# Patient Record
Sex: Male | Born: 1960 | Race: White | Hispanic: No | Marital: Married | Smoking: Never smoker
Health system: Southern US, Community
[De-identification: ages and names within clinical notes are randomized; demographics above are authoritative.]

## PROBLEM LIST (undated history)

## (undated) DIAGNOSIS — Z8719 Personal history of other diseases of the digestive system: Secondary | ICD-10-CM

## (undated) DIAGNOSIS — M1990 Unspecified osteoarthritis, unspecified site: Secondary | ICD-10-CM

## (undated) DIAGNOSIS — I10 Essential (primary) hypertension: Secondary | ICD-10-CM

## (undated) DIAGNOSIS — M5126 Other intervertebral disc displacement, lumbar region: Secondary | ICD-10-CM

## (undated) DIAGNOSIS — K219 Gastro-esophageal reflux disease without esophagitis: Secondary | ICD-10-CM

## (undated) DIAGNOSIS — J302 Other seasonal allergic rhinitis: Secondary | ICD-10-CM

## (undated) DIAGNOSIS — N201 Calculus of ureter: Secondary | ICD-10-CM

## (undated) DIAGNOSIS — J189 Pneumonia, unspecified organism: Secondary | ICD-10-CM

## (undated) HISTORY — DX: Other intervertebral disc displacement, lumbar region: M51.26

## (undated) HISTORY — DX: Essential (primary) hypertension: I10

## (undated) HISTORY — PX: WISDOM TOOTH EXTRACTION: SHX21

## (undated) HISTORY — DX: Other seasonal allergic rhinitis: J30.2

## (undated) HISTORY — PX: VASECTOMY: SHX75

## (undated) HISTORY — PX: ESOPHAGOGASTRODUODENOSCOPY: SHX1529

## (undated) HISTORY — PX: TONSILLECTOMY: SUR1361

---

## 2001-06-04 ENCOUNTER — Encounter: Payer: Self-pay | Admitting: Unknown Physician Specialty

## 2001-06-04 ENCOUNTER — Ambulatory Visit (HOSPITAL_COMMUNITY): Admission: RE | Admit: 2001-06-04 | Discharge: 2001-06-04 | Payer: Self-pay | Admitting: Unknown Physician Specialty

## 2009-01-27 ENCOUNTER — Encounter (INDEPENDENT_AMBULATORY_CARE_PROVIDER_SITE_OTHER): Payer: Self-pay | Admitting: *Deleted

## 2009-02-18 ENCOUNTER — Ambulatory Visit: Payer: Self-pay | Admitting: Gastroenterology

## 2009-02-18 DIAGNOSIS — R1310 Dysphagia, unspecified: Secondary | ICD-10-CM | POA: Insufficient documentation

## 2009-02-19 ENCOUNTER — Ambulatory Visit: Payer: Self-pay | Admitting: Gastroenterology

## 2009-02-19 ENCOUNTER — Ambulatory Visit (HOSPITAL_COMMUNITY): Admission: RE | Admit: 2009-02-19 | Discharge: 2009-02-19 | Payer: Self-pay | Admitting: Gastroenterology

## 2009-03-05 ENCOUNTER — Encounter: Payer: Self-pay | Admitting: Gastroenterology

## 2009-04-16 ENCOUNTER — Ambulatory Visit: Payer: Self-pay | Admitting: Gastroenterology

## 2009-04-17 DIAGNOSIS — K222 Esophageal obstruction: Secondary | ICD-10-CM

## 2011-04-05 NOTE — Op Note (Signed)
Michael Cochran               ACCOUNT NO.:  [id number]   MEDICAL RECORD NO.:  [id number]          PATIENT TYPE:  AMB   LOCATION:  DAY                           FACILITY:  [facility]   PHYSICIAN:  Kassie Mends, M.D.      DATE OF BIRTH:  1961/04/27   DATE OF PROCEDURE:  02/19/2009  DATE OF DISCHARGE:                               OPERATIVE REPORT   REFERRING PHYSICIAN:  Kingsley Callander. Ouida Sills, MD   PROCEDURE:  Esophagogastroduodenoscopy with Savary dilation to 16 mm.   SURGEON:  Kassie Mends, MD   INDICATION FOR EXAMINATION:  Michael Cochran is a 50 year old male who  complaints of solid dysphasia over the last 6-8 years.  His symptoms are  getting worse.  Again, often feels food lodges in his esophagus and then  released.   FINDINGS:  1. Distal esophageal stricture.  The area had surrounding mild      erythema consistent with an impacted food bolus which was      previously lodged and passed.  The distal esophagus was dilated      with the Savary dilators from 12.8 mm to 16 mm.  No evidence of      Barrett mass, erosions, or ulcerations were seen.  2. Normal stomach.  3. Normal duodenal bulb and second portion of the duodenum with mild-      to-moderate bile staining.   DIAGNOSIS:  Distal esophageal stricture, status post dilation.   RECOMMENDATIONS:  1. Soft mechanical diet for 24 hours then high-fiber diet.  2. No aspirin, NSAIDs, or anticoagulation for 7 days.  3. Michael Cochran already has an appointment to follow up with me in 6-8      weeks.   MEDICATIONS:  1. Demerol 75 mg IV.  2. Versed 5 mg IV.   PROCEDURE TECHNIQUE:  Physical exam was performed.  Informed consent was  obtained from the patient after explaining benefits, risks, and  alternatives to the procedure.  The patient was connected to monitor and  placed in left lateral position.  Continuous oxygen was provided by  nasal cannula.  IV medicine administered through an indwelling cannula.  After administration of sedation, the  patient's esophagus was intubated  and the scope was advanced under direct visualization to the second  portion of the duodenum.  The scope was withdrawn into the stomach and  retroflexed view of the cardia was performed.  The Savary guidewire was  introduced.  The scope was withdrawn slowly by carefully examining the  color, texture, anatomy, and integrity of the mucosa on the way out.  Each dilator was passed over the wire successively from 12.8 to 16 mm.  The 12.8-mm passed with no resistance and the 16-mm passed with mild-to-  moderate resistance.  The guidewire and the dilator were removed.  The  patient was recovered in endoscopy and discharged home in satisfactory  condition.      Kassie Mends, M.D.  Electronically Signed     SM/MEDQ  D:  02/19/2009  T:  02/19/2009  Job:  [id number]   cc:   Kingsley Callander. Ouida Sills, MD  Fax: [phone]

## 2013-08-15 ENCOUNTER — Encounter: Payer: Self-pay | Admitting: Gastroenterology

## 2013-08-19 ENCOUNTER — Ambulatory Visit (INDEPENDENT_AMBULATORY_CARE_PROVIDER_SITE_OTHER): Payer: PRIVATE HEALTH INSURANCE | Admitting: Gastroenterology

## 2013-08-19 ENCOUNTER — Encounter: Payer: Self-pay | Admitting: Gastroenterology

## 2013-08-19 ENCOUNTER — Telehealth: Payer: Self-pay | Admitting: Gastroenterology

## 2013-08-19 VITALS — BP 140/84 | HR 86 | Temp 97.6°F | Ht 74.0 in | Wt 268.0 lb

## 2013-08-19 DIAGNOSIS — Z800 Family history of malignant neoplasm of digestive organs: Secondary | ICD-10-CM | POA: Insufficient documentation

## 2013-08-19 DIAGNOSIS — K222 Esophageal obstruction: Secondary | ICD-10-CM

## 2013-08-19 DIAGNOSIS — R1310 Dysphagia, unspecified: Secondary | ICD-10-CM

## 2013-08-19 DIAGNOSIS — R10813 Right lower quadrant abdominal tenderness: Secondary | ICD-10-CM | POA: Insufficient documentation

## 2013-08-19 MED ORDER — PEG-KCL-NACL-NASULF-NA ASC-C 100 G PO SOLR: 1.0000 | ORAL | Status: DC

## 2013-08-19 NOTE — Progress Notes (Signed)
Primary Care Physician:  Carylon Perches, MD  Primary Gastroenterologist:    Chief Complaint  Patient presents with  . Colonoscopy    HPI:  Michael Cochran is a 51 y.o. male here to schedule first ever colonoscopy. Maternal grandfather had colon cancer in his 66s. Patient has 2 siblings and is unsure if they ever had colon polyps. Patient denies constipation, diarrhea, abdominal pain, weight loss, anorexia, melena, rectal bleeding. Some dysphagia to solid foods at times but has been unchanged since 2010. He does not feel that he is ready for an upper endoscopy at this time but his wife states he needs it. He plans to check with his insurance company prior to considering.  Current Outpatient Prescriptions  Medication Sig Dispense Refill  . cetirizine (ZYRTEC) 10 MG tablet Take 10 mg by mouth daily.      Marland Kitchen lisinopril (PRINIVIL,ZESTRIL) 20 MG tablet Take 20 mg by mouth daily.      . Multiple Vitamin (MULTIVITAMIN) capsule Take 1 capsule by mouth daily.       No current facility-administered medications for this visit.    Allergies as of 08/19/2013  . (No Known Allergies)    Past Medical History  Diagnosis Date  . HTN (hypertension)   . Seasonal allergies   . Lumbar herniated disc     Past Surgical History  Procedure Laterality Date  . Esophagogastroduodenoscopy  02/19/2009    SLF: Distal esophageal stricture.The area had surrounding mild erythema c/w impacted food bolus which was previously lodged and passed. The distal esophagus was dilated  with the Savary dilators from 12.8 mm to 16 mm/Normal duodenal bulb and second portion of the duodenum   . Tonsillectomy    . Wisdom tooth extraction      Family History  Problem Relation Age of Onset  . Colon cancer Maternal Grandfather     age 2s    History   Social History  . Marital Status: Married    Spouse Name: N/A    Number of Children: N/A  . Years of Education: N/A   Occupational History  . Not on file.   Social History  Main Topics  . Smoking status: Never Smoker   . Smokeless tobacco: Not on file  . Alcohol Use: No  . Drug Use: No  . Sexual Activity: Not on file   Other Topics Concern  . Not on file   Social History Narrative  . No narrative on file      ROS:  General: Negative for anorexia, weight loss, fever, chills, fatigue, weakness. Eyes: Negative for vision changes.  ENT: Negative for hoarseness, difficulty swallowing , nasal congestion. CV: Negative for chest pain, angina, palpitations, dyspnea on exertion, peripheral edema.  Respiratory: Negative for dyspnea at rest, dyspnea on exertion, cough, sputum, wheezing.  GI: See history of present illness. GU:  Negative for dysuria, hematuria, urinary incontinence, urinary frequency, nocturnal urination.  MS: Negative for joint pain. Chronic intermittent low back pain.  Derm: Negative for rash or itching.  Neuro: Negative for weakness, abnormal sensation, seizure, frequent headaches, memory loss, confusion.  Psych: Negative for anxiety, depression, suicidal ideation, hallucinations.  Endo: Negative for unusual weight change.  Heme: Negative for bruising or bleeding. Allergy: Negative for rash or hives.    Physical Examination:  BP 140/84  Pulse 86  Temp(Src) 97.6 F (36.4 C) (Oral)  Ht 6\' 2"  (1.88 m)  Wt 268 lb (121.564 kg)  BMI 34.39 kg/m2   General: Well-nourished, well-developed in no acute distress.  Head: Normocephalic, atraumatic.   Eyes: Conjunctiva pink, no icterus. Mouth: Oropharyngeal mucosa moist and pink , no lesions erythema or exudate. Neck: Supple without thyromegaly, masses, or lymphadenopathy.  Lungs: Clear to auscultation bilaterally.  Heart: Regular rate and rhythm, no murmurs rubs or gallops.  Abdomen: Bowel sounds are normal,  slightly tender in the right midabdomen, nondistended, no hepatosplenomegaly or masses, no abdominal bruits or    hernia , no rebound or guarding.   Rectal: deferred Extremities: No  lower extremity edema. No clubbing or deformities.  Neuro: Alert and oriented x 4 , grossly normal neurologically.  Skin: Warm and dry, no rash or jaundice.   Psych: Alert and cooperative, normal mood and affect.

## 2013-08-19 NOTE — Telephone Encounter (Signed)
TCS with Poss EGD/ED has been authorized PAC# [id number] per Lawanna Kobus at [facility]

## 2013-08-19 NOTE — Assessment & Plan Note (Signed)
Patient denies any significant heartburn issues. Follows antireflux measures. History of esophageal stricture in the past status post dilation in 2010. States his dysphagia improved but not to 100% but is been stable since that time. He is not sure that he wants to consider upper endoscopy with dilation at this time. He plans to discuss with his insurance company first.

## 2013-08-19 NOTE — Patient Instructions (Addendum)
1. We have scheduled you for a colonoscopy with Dr. Darrick Penna. Please see separate instructions. 2. As discussed today, if you decide that you like to have an upper endoscopy with esophageal dilation at time of your colonoscopy you will need to let us know as soon as possible. Please call [phone].

## 2013-08-19 NOTE — Progress Notes (Signed)
Cc to PCP 

## 2013-08-19 NOTE — Assessment & Plan Note (Signed)
52 year old gentleman without any lower GI symptoms who presents for his first ever colonoscopy. Colon cancer in a second-degree relative only. Continues to be average risk himself. On exam he had vague right mid to right lower quadrant tenderness on exam. Colonoscopy in the near future.  I have discussed the risks, alternatives, benefits with regards to but not limited to the risk of reaction to medication, bleeding, infection, perforation and the patient is agreeable to proceed. Written consent to be obtained.

## 2013-08-22 ENCOUNTER — Telehealth: Payer: Self-pay | Admitting: Gastroenterology

## 2013-08-22 NOTE — Telephone Encounter (Signed)
Pt called during the afternoon wanting to speak with LSL regarding his procedure on 10/31 with SF. He has questions about the upper esophageal dilation and questions regarding the cost. He asked for LSL to call him at work ([employer]) either today before 3pm or sometime tomorrow [phone]

## 2013-08-23 NOTE — Telephone Encounter (Signed)
I left a message on Michael Cochran's voicemail stating that the EGD is about $685.00 and the dilation is about $285 and this doesn't include any contractual write offs

## 2013-08-23 NOTE — Telephone Encounter (Signed)
Michael Cochran,  Please call the patient. I cannot answer questions regarding cost, you or Soledad Gerlach likely know that better than me. If you are unable to answer his questions about the dilation, let me know and I will gladly talk with him.

## 2013-08-23 NOTE — Telephone Encounter (Signed)
Routing to CM 

## 2013-08-26 NOTE — Telephone Encounter (Signed)
Let's make sure patient has no other concerns. If he wants to add EGD to his TCS (scheduled for 10/31) we need to know sooner than later.

## 2013-08-27 NOTE — Telephone Encounter (Signed)
Routing to DS- this is a SLF pt. 

## 2013-08-28 NOTE — Telephone Encounter (Signed)
REVIEWED.  

## 2013-08-28 NOTE — Telephone Encounter (Signed)
Called and informed pt. Per Soledad Gerlach, she had already talked to him also, and she had added the EGD/ED. He was fine with that.

## 2013-08-28 NOTE — Telephone Encounter (Signed)
Thanks

## 2013-09-05 ENCOUNTER — Encounter (HOSPITAL_COMMUNITY): Payer: Self-pay | Admitting: Pharmacy Technician

## 2013-09-20 ENCOUNTER — Ambulatory Visit (HOSPITAL_COMMUNITY)
Admission: RE | Admit: 2013-09-20 | Discharge: 2013-09-20 | Disposition: A | Discharge status: Home / Self Care | Payer: PRIVATE HEALTH INSURANCE | Source: Ambulatory Visit | Attending: Gastroenterology | Admitting: Gastroenterology

## 2013-09-20 ENCOUNTER — Encounter (HOSPITAL_COMMUNITY): Payer: Self-pay | Admitting: *Deleted

## 2013-09-20 ENCOUNTER — Encounter (HOSPITAL_COMMUNITY)
Admission: RE | Disposition: A | Discharge status: Home / Self Care | Payer: Self-pay | Source: Ambulatory Visit | Attending: Gastroenterology

## 2013-09-20 DIAGNOSIS — K2980 Duodenitis without bleeding: Secondary | ICD-10-CM

## 2013-09-20 DIAGNOSIS — Z1211 Encounter for screening for malignant neoplasm of colon: Secondary | ICD-10-CM

## 2013-09-20 DIAGNOSIS — K2960 Other gastritis without bleeding: Secondary | ICD-10-CM

## 2013-09-20 DIAGNOSIS — K5730 Diverticulosis of large intestine without perforation or abscess without bleeding: Secondary | ICD-10-CM

## 2013-09-20 DIAGNOSIS — Z800 Family history of malignant neoplasm of digestive organs: Secondary | ICD-10-CM

## 2013-09-20 DIAGNOSIS — K644 Residual hemorrhoidal skin tags: Secondary | ICD-10-CM | POA: Insufficient documentation

## 2013-09-20 DIAGNOSIS — R1310 Dysphagia, unspecified: Secondary | ICD-10-CM | POA: Insufficient documentation

## 2013-09-20 DIAGNOSIS — K222 Esophageal obstruction: Secondary | ICD-10-CM | POA: Insufficient documentation

## 2013-09-20 DIAGNOSIS — I10 Essential (primary) hypertension: Secondary | ICD-10-CM | POA: Insufficient documentation

## 2013-09-20 DIAGNOSIS — K648 Other hemorrhoids: Secondary | ICD-10-CM

## 2013-09-20 DIAGNOSIS — K449 Diaphragmatic hernia without obstruction or gangrene: Secondary | ICD-10-CM | POA: Insufficient documentation

## 2013-09-20 DIAGNOSIS — R10813 Right lower quadrant abdominal tenderness: Secondary | ICD-10-CM

## 2013-09-20 DIAGNOSIS — K2970 Gastritis, unspecified, without bleeding: Secondary | ICD-10-CM | POA: Insufficient documentation

## 2013-09-20 HISTORY — PX: MALONEY DILATION: SHX5535

## 2013-09-20 HISTORY — PX: COLONOSCOPY: SHX5424

## 2013-09-20 HISTORY — PX: SAVORY DILATION: SHX5439

## 2013-09-20 HISTORY — PX: ESOPHAGOGASTRODUODENOSCOPY: SHX5428

## 2013-09-20 SURGERY — COLONOSCOPY
Anesthesia: Moderate Sedation

## 2013-09-20 MED ORDER — MIDAZOLAM HCL 5 MG/5ML IJ SOLN
INTRAMUSCULAR | Status: DC | PRN
  Administered 2013-09-20 (×3): 2 mg via INTRAVENOUS
  Administered 2013-09-20: 1 mg via INTRAVENOUS

## 2013-09-20 MED ORDER — MIDAZOLAM HCL 5 MG/5ML IJ SOLN
INTRAMUSCULAR | Status: AC
  Filled 2013-09-20: qty 10

## 2013-09-20 MED ORDER — MINERAL OIL PO OIL
TOPICAL_OIL | ORAL | Status: AC
  Filled 2013-09-20: qty 30

## 2013-09-20 MED ORDER — MEPERIDINE HCL 100 MG/ML IJ SOLN
INTRAMUSCULAR | Status: AC
  Filled 2013-09-20: qty 2

## 2013-09-20 MED ORDER — OMEPRAZOLE 20 MG PO CPDR: DELAYED_RELEASE_CAPSULE | ORAL | Status: DC

## 2013-09-20 MED ORDER — SODIUM CHLORIDE 0.9 % IV SOLN
INTRAVENOUS | Status: DC
  Administered 2013-09-20: 08:00:00 via INTRAVENOUS

## 2013-09-20 MED ORDER — MEPERIDINE HCL 100 MG/ML IJ SOLN
INTRAMUSCULAR | Status: DC | PRN
  Administered 2013-09-20 (×3): 25 mg via INTRAVENOUS
  Administered 2013-09-20: 50 mg via INTRAVENOUS

## 2013-09-20 NOTE — Op Note (Signed)
[facility] [address] [place], [address]   ENDOSCOPY PROCEDURE REPORT  PATIENT: Michael Cochran, Michael Cochran  MR#: [id number] BIRTHDATE: 10-22-1961 , 52  yrs. old GENDER: Male  ENDOSCOPIST: Jonette Eva, MD REFFERED BY:Roy Ouida Sills, M.D.  PROCEDURE DATE:  09/20/2013 PROCEDURE:   EGD with biopsy and EGD with dilatation over guidewire   INDICATIONS:1.  dysphagia. MEDICATIONS: TCS+ Demerol 25 mg IV and Versed 1mg  IV TOPICAL ANESTHETIC: Cetacaine Spray  DESCRIPTION OF PROCEDURE:   After the risks benefits and alternatives of the procedure were thoroughly explained, informed consent was obtained.  The EC-3890Li ([id number]) and EG-[id number] ([id number])  endoscope was introduced through the mouth and advanced to the second portion of the duodenum. The instrument was slowly withdrawn as the mucosa was carefully examined.  Prior to withdrawal of the scope, the guidwire was placed.  The esophagus was dilated successfully.  The patient was recovered in endoscopy and discharged home in satisfactory condition.   ESOPHAGUS: A stricture was found at the gastroesophageal junction. The stenosis was traversable with the endoscope.   A small hiatal hernia was noted.   STOMACH: Moderate erosive gastritis (inflammation) was found in the gastric antrum.  Multiple biopsies were performed.   DUODENUM: Mild duodenal inflammation was found in the duodenal bulb.   The duodenal mucosa showed no abnormalities in the 2nd part of the duodenum.   Dilation was then performed at the gastroesphageal junction Dilator: Savary over guidewire Size(s): 14, 16 MM Resistance: minimal Heme: yes-SLIGHT  COMPLICATIONS: There were no complications.  ENDOSCOPIC IMPRESSION: 1.   Stricture at the gastroesophageal junction 2.   Small hiatal hernia 3.   MODERATE Erosive gastritis & MILD DUODENITIS  RECOMMENDATIONS: START OMEPRAZOLE.  TAKE 30 MINUTES PRIOR TO YOUR MEALS TWICE DAILY FOR 3 MONTHS. AVOID ASPIRIN,  IBUPROFEN, MOTRIN, ALEVE, BC/GOODY POWDERS FOR 2 WEEKS.  USE TYLENOL FOR PAIN. FOLLOW A HIGH FIBER/LOW FAT DIET.  AVOID ITEMS THAT CAUSE BLOATING.  BIOPSY RESULTS SHOULD BE BACK IN 7 DAYS. FOLLOW UP IN 3 MOS.  Next colonoscopy in 10 years WITH AN OVERTUBE.      _______________________________ Rosalie DoctorJonette Eva, MD 09/20/2013 5:03 PM      PATIENT NAME:  Michael Cochran, Michael Cochran MR#: [id number]

## 2013-09-20 NOTE — H&P (Addendum)
  Primary Care Physician:  Carylon Perches, MD Primary Gastroenterologist:  Dr. Darrick Penna  Pre-Procedure History & Physical: HPI:  Michael Cochran is a 52 y.o. male here for DYSPHAGIA/screening.  Past Medical History  Diagnosis Date  . HTN (hypertension)   . Seasonal allergies   . Lumbar herniated disc     Past Surgical History  Procedure Laterality Date  . Esophagogastroduodenoscopy  02/19/2009    SLF: Distal esophageal stricture.The area had surrounding mild erythema c/w impacted food bolus which was previously lodged and passed. The distal esophagus was dilated  with the Savary dilators from 12.8 mm to 16 mm/Normal duodenal bulb and second portion of the duodenum   . Tonsillectomy    . Wisdom tooth extraction      Prior to Admission medications   Medication Sig Start Date End Date Taking? Authorizing Provider  lisinopril (PRINIVIL,ZESTRIL) 20 MG tablet Take 20 mg by mouth daily.   Yes Historical Provider, MD  Multiple Vitamin (MULTIVITAMIN WITH MINERALS) TABS tablet Take 1 tablet by mouth daily.   Yes Historical Provider, MD  peg 3350 powder (MOVIPREP) 100 G SOLR Take 1 kit (200 g total) by mouth as directed. 08/19/13  Yes West Bali, MD  cetirizine (ZYRTEC) 10 MG tablet Take 10 mg by mouth daily as needed for allergies.     Historical Provider, MD    Allergies as of 08/19/2013  . (No Known Allergies)    Family History  Problem Relation Age of Onset  . Colon cancer Maternal Grandfather     age 29s    History   Social History  . Marital Status: Married    Spouse Name: N/A    Number of Children: N/A  . Years of Education: N/A   Occupational History  . Not on file.   Social History Main Topics  . Smoking status: Never Smoker   . Smokeless tobacco: Not on file  . Alcohol Use: No  . Drug Use: No  . Sexual Activity: Not on file   Other Topics Concern  . Not on file   Social History Narrative  . No narrative on file    Review of Systems: See HPI, otherwise  negative ROS   Physical Exam: BP 135/89  Pulse 67  Temp(Src) 97.8 F (36.6 C) (Oral)  Resp 12  Ht 6\' 2"  (1.88 m)  Wt 260 lb (117.935 kg)  BMI 33.37 kg/m2  SpO2 97% General:   Alert,  pleasant and cooperative in NAD Head:  Normocephalic and atraumatic. Neck:  Supple; Lungs:  Clear throughout to auscultation.    Heart:  Regular rate and rhythm. Abdomen:  Soft, nontender and nondistended. Normal bowel sounds, without guarding, and without rebound.   Neurologic:  Alert and  oriented x4;  grossly normal neurologically.  Impression/Plan:      DYSPHAGIA/screening  PLAN:  EGD/DIL/tcs TODAY

## 2013-09-20 NOTE — Op Note (Signed)
[facility] [address] [place], [address]   COLONOSCOPY PROCEDURE REPORT  PATIENT: Michael Cochran, Michael Cochran  MR#: [id number] BIRTHDATE: 11-10-1961 , 52  yrs. old GENDER: Male ENDOSCOPIST: Jonette Eva, MD REFERRED BY:Roy Ouida Sills, M.D. PROCEDURE DATE:  09/20/2013 PROCEDURE:   Colonoscopy, screening INDICATIONS:Average risk patient for colon cancer.  GRANDFATHER HAD COLON CA. MEDICATIONS: Demerol 100 mg IV and Versed 6 mg IV  DESCRIPTION OF PROCEDURE:    Physical exam was performed.  Informed consent was obtained from the patient after explaining the benefits, risks, and alternatives to procedure.  The patient was connected to monitor and placed in left lateral position. Continuous oxygen was provided by nasal cannula and IV medicine administered through an indwelling cannula.  After administration of sedation and rectal exam, the patients rectum was intubated and the EC-3890Li ([id number])  colonoscope was advanced under direct visualization to the cecum.  The scope was removed slowly by carefully examining the color, texture, anatomy, and integrity mucosa on the way out.  The patient was recovered in endoscopy and discharged home in satisfactory condition.    COLON FINDINGS: Mild diverticulosis was noted IN SIGMOID COLON.  The colon mucosa was otherwise normal.  NO POLYPS SEEN. Small external hemorrhoids were found.  PREP QUALITY: good.  CECAL W/D TIME: 12 minutes     COMPLICATIONS: None  ENDOSCOPIC IMPRESSION: 1.   Mild diverticulosis IN THE SIGMOID COLON 2.   Small external hemorrhoids   RECOMMENDATIONS: START OMEPRAZOLE.  TAKE 30 MINUTES PRIOR TO YOUR MEALS TWICE DAILY FOR 3 MONTHS. AVOID ASPIRIN, IBUPROFEN, MOTRIN, ALEVE, BC/GOODY POWDERS FOR 2 WEEKS.  USE TYLENOL FOR PAIN. FOLLOW A HIGH FIBER/LOW FAT DIET.  AVOID ITEMS THAT CAUSE BLOATING.  BIOPSY RESULTS SHOULD BE BACK IN 7 DAYS. FOLLOW UP IN 3 MOS.  Next colonoscopy in 10  years.       _______________________________ Rosalie DoctorJonette Eva, MD 09/20/2013 4:58 PM

## 2013-09-30 ENCOUNTER — Telehealth: Payer: Self-pay

## 2013-09-30 NOTE — Telephone Encounter (Signed)
Called and informed pt. Michael Cochran, please send procedure and path reports to Dr. Ouida Sills. Pt has appt there this afternoon.

## 2013-09-30 NOTE — Telephone Encounter (Signed)
REMINDER APPTS MADE  

## 2013-09-30 NOTE — Telephone Encounter (Signed)
Faxed to pcp °

## 2013-09-30 NOTE — Telephone Encounter (Signed)
Please call pt. HE DOES NOT HAVE H PYLORI INFECTION.   CHANGE OMEPRAZOLE TO 30 MINUTES PRIOR TO YOUR FIRST MEAL FOR 3 MONTHS. MINIMIZE YOUR USE OF ASPIRIN, IBUPROFEN, MOTRIN, ALEVE, BC/GOODY POWDERS FOR 2 WEEKS. USE TYLENOL FOR PAIN.  FOLLOW A HIGH FIBER/LOW FAT DIET. AVOID ITEMS THAT CAUSE BLOATING.   FOLLOW UP IN 3 MOS E15 DYSPHAGIA.  Next colonoscopy in 10 years.

## 2013-09-30 NOTE — Telephone Encounter (Signed)
Pt left VM  That he has appt with Dr. Ouida Sills for physical today around lunch time and he would like results from procedures for Dr. Ouida Sills. I left VM for him that I would let Dr. Darrick Penna know so we can get those results to Dr. Ouida Sills.

## 2014-02-02 ENCOUNTER — Ambulatory Visit (INDEPENDENT_AMBULATORY_CARE_PROVIDER_SITE_OTHER): Payer: PRIVATE HEALTH INSURANCE | Admitting: Physician Assistant

## 2014-02-02 VITALS — BP 118/76 | HR 84 | Temp 98.5°F | Resp 16 | Ht 74.0 in | Wt 260.0 lb

## 2014-02-02 DIAGNOSIS — W57XXXA Bitten or stung by nonvenomous insect and other nonvenomous arthropods, initial encounter: Principal | ICD-10-CM

## 2014-02-02 DIAGNOSIS — S30860A Insect bite (nonvenomous) of lower back and pelvis, initial encounter: Secondary | ICD-10-CM

## 2014-02-02 DIAGNOSIS — S30861A Insect bite (nonvenomous) of abdominal wall, initial encounter: Secondary | ICD-10-CM

## 2014-02-02 NOTE — Progress Notes (Signed)
   Subjective:    Patient ID: Michael Cochran, male    DOB: 1961-04-22, 53 y.o.   MRN: [id number]  HPI Patient reports with tick bite found 6 days ago on the right mid abdomen about 10 pm at night and removed it.  The tick was "completely flat".  He had been walking in a field for work the previous day and thinks he might have picked it up on his clothing then or from one of his dogs later the same day.  He denies fever, chills, fatigue, sweats.  Review of Systems  Constitutional: Negative for fever, chills, diaphoresis and fatigue.  Skin:       Tick bite to right mid abdomen      Objective:   Physical Exam  Constitutional: He is oriented to person, place, and time. He appears well-developed and well-nourished. No distress.  HENT:  Head: Normocephalic and atraumatic.  Eyes: Pupils are equal, round, and reactive to light.  Neurological: He is alert and oriented to person, place, and time.  Skin: Skin is warm and dry.  Excoriation consistent with tick bite to right mid abdomen   Psychiatric: He has a normal mood and affect. His behavior is normal. Judgment and thought content normal.      Assessment & Plan:   1. Tick bite of abdomen No signs of infection noted.  Instructed to apply triple antibiotic after the shower for the next 3 days.

## 2014-02-02 NOTE — Progress Notes (Signed)
I have examined this patient along with the student and agree.  Very low risk of tick-borne illness, given un-engorged tick body and <48 hours of contact. Reassurred.

## 2014-02-12 NOTE — Progress Notes (Signed)
REVIEWED. TCS OCT 2014 L TICS/IH EGD OCT 2014 NL [facility]STRIC MUCOSA

## 2014-02-19 ENCOUNTER — Encounter: Payer: Self-pay | Admitting: Gastroenterology

## 2014-02-19 ENCOUNTER — Encounter (INDEPENDENT_AMBULATORY_CARE_PROVIDER_SITE_OTHER): Payer: Self-pay

## 2014-02-19 ENCOUNTER — Ambulatory Visit (INDEPENDENT_AMBULATORY_CARE_PROVIDER_SITE_OTHER): Payer: PRIVATE HEALTH INSURANCE | Admitting: Gastroenterology

## 2014-02-19 VITALS — BP 145/90 | HR 97 | Temp 98.3°F | Ht 74.0 in | Wt 262.6 lb

## 2014-02-19 DIAGNOSIS — Z800 Family history of malignant neoplasm of digestive organs: Secondary | ICD-10-CM

## 2014-02-19 DIAGNOSIS — K219 Gastro-esophageal reflux disease without esophagitis: Secondary | ICD-10-CM

## 2014-02-19 DIAGNOSIS — R1310 Dysphagia, unspecified: Secondary | ICD-10-CM

## 2014-02-19 DIAGNOSIS — R10813 Right lower quadrant abdominal tenderness: Secondary | ICD-10-CM

## 2014-02-19 NOTE — Assessment & Plan Note (Signed)
1. LOSE WEIGHT. GET YOUR BODY MASS INDEX LESS THAN 30. YOU CAN USE A BMI CALCULATOR ON THE INTERNET. 2. MINIMIZE EXPOSURE TO ALCOHOL OR TOBACCO PRODUCTS. 3. AVOID HIGHLY PROCESSED  AND FRIED FOODS. 4. FOLLOW A LOW FAT/HIGH FIBER DIET. SEE INFO BELOW. 5. Use Prilosec 30 minutes prior to your first meal OR TWICE DAILY TO AVOID HAVING HEARTBURN OR INDIGESTION. 6. FOLLOW UP IN 6 MOS.  7. REPEAT EGD IN 2017. CALL FOR ONE SOONER IF YOU HAVE DIFFICULTY SWALLOWING. 8. FOLLOW LIFESTYLE RECOMMENDATIONS FOR PEOPLE WITH REFLUX. SEE INFO BELOW. 9. DISCUSSED WITH PT RISK FACTORS FOR ESOPHAGEAL CANCER, [employer] WITH [employer] CA.

## 2014-02-19 NOTE — Assessment & Plan Note (Signed)
NEXT TCS IN 2024 

## 2014-02-19 NOTE — Progress Notes (Signed)
Reminder in epic °

## 2014-02-19 NOTE — Patient Instructions (Signed)
TO REDUCE YOUR RISK FOR CANCER(ESOPHAGEAL OR COLON): 1. LOSE WEIGHT. GET YOUR BODY MASS INDEX LESS THAN 30. YOU CAN USE A BMI CALCULATOR ON THE INTERNET. 2. MINIMIZE EXPOSURE TO ALCOHOL OR TOBACCO PRODUCTS. 3. AVOID HIGHLY PROCESSED  AND FRIED FOODS. 4. FOLLOW A LOW FAT/HIGH FIBER DIET. SEE INFO BELOW. 5. Use Prilosec 30 minutes prior to your first meal OR TWICE DAILY TO AVOID HAVING HEARTBURN OR INDIGESTION. 6. FOLLOW UP IN 6 MOS.  7. REPEAT EGD IN 2017. CALL FOR ONE SOONER IF YOU HAVE DIFFICULTY SWALLOWING. 8. FOLLOW LIFESTYLE RECOMMENDATIONS FOR PEOPLE WITH REFLUX. SEE INFO BELOW.    Lifestyle and home remedies TO CONTROL HEARTBURN. You may eliminate or reduce the frequency of heartburn by making the following lifestyle changes:   Control your weight. Being overweight is a major risk factor for heartburn and GERD. Excess pounds put pressure on your abdomen, pushing up your stomach and causing acid to back up into your esophagus.    Eat smaller meals. 4 TO 6 MEALS A DAY. This reduces pressure on the lower esophageal sphincter, helping to prevent the valve from opening and acid from washing back into your esophagus.    Loosen your belt. Clothes that fit tightly around your waist put pressure on your abdomen and the lower esophageal sphincter.    Eliminate heartburn triggers. Everyone has specific triggers. Common triggers such as fatty or fried foods, spicy food, tomato sauce, carbonated beverages, alcohol, chocolate, mint, garlic, onion, caffeine and nicotine may make heartburn worse.    Avoid stooping or bending. Tying your shoes is OK. Bending over for longer periods to weed your garden isn't, especially soon after eating.    Don't lie down after a meal. Wait at least three to four hours after eating before going to bed, and don't lie down right after eating.    Raise the head of your bed. An elevation of about six to nine inches puts gravity to work for you. You can do this by placing wooden  or cement blocks under the feet of your bed at the head end. If it's not possible to elevate your bed, you can insert a wedge between your mattress and box spring to elevate your body from the waist up. Wedges are available at drugstores and medical supply stores. Raising your head only by using pillows is not a good alternative.   Alternative medicine   Several home remedies exist for treating GERD, but they provide only temporary relief. They include drinking baking soda (sodium bicarbonate) added to water or drinking other fluids such as baking soda mixed with cream of tartar and water.   Although these liquids create temporary relief by neutralizing, washing away or buffering acids, eventually they aggravate the situation by adding gas and fluid to your stomach, increasing pressure and causing more acid reflux. Further, adding more sodium to your diet may increase your blood pressure and add stress to your heart, and excessive bicarbonate ingestion can alter the acid-base balance in your body.   High-Fiber Diet A high-fiber diet changes your normal diet to include more whole grains, legumes, fruits, and vegetables. Changes in the diet involve replacing refined carbohydrates with unrefined foods. The calorie level of the diet is essentially unchanged. The Dietary Reference Intake (recommended amount) for adult males is 38 grams per day. For adult females, it is 25 grams per day. Pregnant and lactating women should consume 28 grams of fiber per day. Fiber is the intact part of a plant that  is not broken down during digestion. Functional fiber is fiber that has been isolated from the plant to provide a beneficial effect in the body. PURPOSE  Increase stool bulk.   Ease and regulate bowel movements.   Lower cholesterol.  INDICATIONS THAT YOU NEED MORE FIBER  Constipation and hemorrhoids.   Uncomplicated diverticulosis (intestine condition) and irritable bowel syndrome.   Weight management.    As a protective measure against hardening of the arteries (atherosclerosis), diabetes, and cancer.   GUIDELINES FOR INCREASING FIBER IN THE DIET  Start adding fiber to the diet slowly. A gradual increase of about 5 more grams (2 slices of whole-wheat bread, 2 servings of most fruits or vegetables, or 1 bowl of high-fiber cereal) per day is best. Too rapid an increase in fiber may result in constipation, flatulence, and bloating.   Drink enough water and fluids to keep your urine clear or pale yellow. Water, juice, or caffeine-free drinks are recommended. Not drinking enough fluid may cause constipation.   Eat a variety of high-fiber foods rather than one type of fiber.   Try to increase your intake of fiber through using high-fiber foods rather than fiber pills or supplements that contain small amounts of fiber.   The goal is to change the types of food eaten. Do not supplement your present diet with high-fiber foods, but replace foods in your present diet.  INCLUDE A VARIETY OF FIBER SOURCES  Replace refined and processed grains with whole grains, canned fruits with fresh fruits, and incorporate other fiber sources. White rice, white breads, and most bakery goods contain little or no fiber.   Brown whole-grain rice, buckwheat oats, and many fruits and vegetables are all good sources of fiber. These include: broccoli, Brussels sprouts, cabbage, cauliflower, beets, sweet potatoes, white potatoes (skin on), carrots, tomatoes, eggplant, squash, berries, fresh fruits, and dried fruits.   Cereals appear to be the richest source of fiber. Cereal fiber is found in whole grains and bran. Bran is the fiber-rich outer coat of cereal grain, which is largely removed in refining. In whole-grain cereals, the bran remains. In breakfast cereals, the largest amount of fiber is found in those with "bran" in their names. The fiber content is sometimes indicated on the label.   You may need to include  additional fruits and vegetables each day.   In baking, for 1 cup white flour, you may use the following substitutions:   1 cup whole-wheat flour minus 2 tablespoons.   1/2 cup white flour plus 1/2 cup whole-wheat flour.   Low-Fat Diet BREADS, CEREALS, PASTA, RICE, DRIED PEAS, AND BEANS These products are high in carbohydrates and most are low in fat. Therefore, they can be increased in the diet as substitutes for fatty foods. They too, however, contain calories and should not be eaten in excess. Cereals can be eaten for snacks as well as for breakfast.   FRUITS AND VEGETABLES It is good to eat fruits and vegetables. Besides being sources of fiber, both are rich in vitamins and some minerals. They help you get the daily allowances of these nutrients. Fruits and vegetables can be used for snacks and desserts.  MEATS Limit lean meat, chicken, [place], and fish to no more than 6 ounces per day. Beef, Pork, and Lamb Use lean cuts of beef, pork, and lamb. Lean cuts include:  Extra-lean ground beef.  Arm roast.  Sirloin tip.  Center-cut ham.  Round steak.  Loin chops.  Rump roast.  Tenderloin.  Trim all  fat off the outside of meats before cooking. It is not necessary to severely decrease the intake of red meat, but lean choices should be made. Lean meat is rich in protein and contains a highly absorbable form of iron. Premenopausal women, in particular, should avoid reducing lean red meat because this could increase the risk for low red blood cells (iron-deficiency anemia).  Chicken and [place] These are good sources of protein. The fat of poultry can be reduced by removing the skin and underlying fat layers before cooking. Chicken and [place] can be substituted for lean red meat in the diet. Poultry should not be fried or covered with high-fat sauces. Fish and Shellfish Fish is a good source of protein. Shellfish contain cholesterol, but they usually are low in saturated fatty acids. The  preparation of fish is important. Like chicken and [place], they should not be fried or covered with high-fat sauces. EGGS Egg whites contain no fat or cholesterol. They can be eaten often. Try 1 to 2 egg whites instead of whole eggs in recipes or use egg substitutes that do not contain yolk. MILK AND DAIRY PRODUCTS Use skim or 1% milk instead of 2% or whole milk. Decrease whole milk, natural, and processed cheeses. Use nonfat or low-fat (2%) cottage cheese or low-fat cheeses made from vegetable oils. Choose nonfat or low-fat (1 to 2%) yogurt. Experiment with evaporated skim milk in recipes that call for heavy cream. Substitute low-fat yogurt or low-fat cottage cheese for sour cream in dips and salad dressings. Have at least 2 servings of low-fat dairy products, such as 2 glasses of skim (or 1%) milk each day to help get your daily calcium intake. FATS AND OILS Reduce the total intake of fats, especially saturated fat. Butterfat, lard, and beef fats are high in saturated fat and cholesterol. These should be avoided as much as possible. Vegetable fats do not contain cholesterol, but certain vegetable fats, such as coconut oil, palm oil, and palm kernel oil are very high in saturated fats. These should be limited. These fats are often used in bakery goods, processed foods, popcorn, oils, and nondairy creamers. Vegetable shortenings and some peanut butters contain hydrogenated oils, which are also saturated fats. Read the labels on these foods and check for saturated vegetable oils. Unsaturated vegetable oils and fats do not raise blood cholesterol. However, they should be limited because they are fats and are high in calories. Total fat should still be limited to 30% of your daily caloric intake. Desirable liquid vegetable oils are corn oil, cottonseed oil, olive oil, canola oil, safflower oil, soybean oil, and sunflower oil. Peanut oil is not as good, but small amounts are acceptable. Buy a heart-healthy tub  margarine that has no partially hydrogenated oils in the ingredients. Mayonnaise and salad dressings often are made from unsaturated fats, but they should also be limited because of their high calorie and fat content. Seeds, nuts, peanut butter, olives, and avocados are high in fat, but the fat is mainly the unsaturated type. These foods should be limited mainly to avoid excess calories and fat. OTHER EATING TIPS Snacks  Most sweets should be limited as snacks. They tend to be rich in calories and fats, and their caloric content outweighs their nutritional value. Some good choices in snacks are graham crackers, melba toast, soda crackers, bagels (no egg), English muffins, fruits, and vegetables. These snacks are preferable to snack crackers, [place] fries, TORTILLA CHIPS, and POTATO chips. Popcorn should be air-popped or cooked in small  amounts of liquid vegetable oil. Desserts Eat fruit, low-fat yogurt, and fruit ices instead of pastries, cake, and cookies. Sherbet, angel food cake, gelatin dessert, frozen low-fat yogurt, or other frozen products that do not contain saturated fat (pure fruit juice bars, frozen ice pops) are also acceptable.  COOKING METHODS Choose those methods that use little or no fat. They include: Poaching.  Braising.  Steaming.  Grilling.  Baking.  Stir-frying.  Broiling.  Microwaving.  Foods can be cooked in a nonstick pan without added fat, or use a nonfat cooking spray in regular cookware. Limit fried foods and avoid frying in saturated fat. Add moisture to lean meats by using water, broth, cooking wines, and other nonfat or low-fat sauces along with the cooking methods mentioned above. Soups and stews should be chilled after cooking. The fat that forms on top after a few hours in the refrigerator should be skimmed off. When preparing meals, avoid using excess salt. Salt can contribute to raising blood pressure in some people.  EATING AWAY FROM HOME Order entres,  potatoes, and vegetables without sauces or butter. When meat exceeds the size of a deck of cards (3 to 4 ounces), the rest can be taken home for another meal. Choose vegetable or fruit salads and ask for low-calorie salad dressings to be served on the side. Use dressings sparingly. Limit high-fat toppings, such as bacon, crumbled eggs, cheese, sunflower seeds, and olives. Ask for heart-healthy tub margarine instead of butter.

## 2014-02-19 NOTE — Progress Notes (Signed)
cc'd to pcp 

## 2014-02-19 NOTE — Assessment & Plan Note (Deleted)
1. LOSE WEIGHT. GET YOUR BODY MASS INDEX LESS THAN 30. YOU CAN USE A BMI CALCULATOR ON THE INTERNET. 2. MINIMIZE EXPOSURE TO ALCOHOL OR TOBACCO PRODUCTS. 3. AVOID HIGHLY PROCESSED  AND FRIED FOODS. 4. FOLLOW A LOW FAT/HIGH FIBER DIET. SEE INFO BELOW. 5. Use Prilosec 30 minutes prior to your first meal OR TWICE DAILY TO AVOID HAVING HEARTBURN OR INDIGESTION. 6. FOLLOW UP IN 6 MOS.  7. REPEAT EGD IN 2017. CALL FOR ONE SOONER IF YOU HAVE DIFFICULTY SWALLOWING. 8. FOLLOW LIFESTYLE RECOMMENDATIONS FOR PEOPLE WITH REFLUX. SEE INFO BELOW.

## 2014-02-19 NOTE — Assessment & Plan Note (Signed)
SX RESOLVED.  CONTINUE TO MONITOR SYMPTOMS.  

## 2014-02-19 NOTE — Progress Notes (Signed)
   Subjective:    Patient ID: Michael Cochran, male    DOB: July 01, 1961, 53 y.o.   MRN: [id number] Carylon PerchesFAGAN,ROY, MD  HPI MAY HAVE A FLARE OF HEARTBURN. SWALLOWING DOING GOOD. TAKING PRILOSEC PRN. BMs: 3x/day. PT DENIES FEVER, CHILLS, BRBPR, nausea, vomiting, melena, diarrhea, constipation, abd pain, problems swallowing, OR problems with sedation.   Past Medical History  Diagnosis Date  . HTN (hypertension)   . Seasonal allergies   . Lumbar herniated disc    Past Surgical History  Procedure Laterality Date  . Esophagogastroduodenoscopy  02/19/2009    SLF: Distal esophageal stricture.The area had surrounding mild erythema c/w impacted food bolus which was previously lodged and passed. The distal esophagus was dilated  with the Savary dilators from 12.8 mm to 16 mm/Normal duodenal bulb and second portion of the duodenum   . Tonsillectomy    . Wisdom tooth extraction    . Colonoscopy N/A 09/20/2013    Procedure: COLONOSCOPY;  Surgeon: West BaliSandi L Erico Stan, MD;  Location: [facility];  Service: Endoscopy;  Laterality: N/A;  8:30  . Esophagogastroduodenoscopy N/A 09/20/2013    Procedure: ESOPHAGOGASTRODUODENOSCOPY (EGD);  Surgeon: West BaliSandi L Torris House, MD;  Location: [facility];  Service: Endoscopy;  Laterality: N/A;  . Savory dilation N/A 09/20/2013    Procedure: SAVORY DILATION;  Surgeon: West BaliSandi L Kailiana Granquist, MD;  Location: [facility];  Service: Endoscopy;  Laterality: N/A;  .           No Known Allergies  Current Outpatient Prescriptions  Medication Sig Dispense Refill  . cetirizine (ZYRTEC) 10 MG tablet Take 10 mg by mouth daily as needed for allergies.   AT NIGHT    . lisinopril (PRINIVIL,ZESTRIL) 20 MG tablet Take 10 mg by mouth daily.       .        . omeprazole (PRILOSEC) 20 MG capsule 1 po once daily PRN        Review of Systems     Objective:   Physical Exam  Vitals reviewed. Constitutional: He is oriented to person, place, and time. He appears well-nourished. No distress.    HENT:  Head: Normocephalic and atraumatic.  Mouth/Throat: Oropharynx is clear and moist. No oropharyngeal exudate.  Eyes: Pupils are equal, round, and reactive to light. No scleral icterus.  Neck: Normal range of motion. Neck supple.  Cardiovascular: Normal rate, regular rhythm and normal heart sounds.   Pulmonary/Chest: Effort normal and breath sounds normal. No respiratory distress.  Abdominal: Soft. Bowel sounds are normal. He exhibits no distension. There is no tenderness.  Musculoskeletal: He exhibits no edema.  Lymphadenopathy:    He has no cervical adenopathy.  Neurological: He is alert and oriented to person, place, and time.  NO FOCAL DEFICITS   Psychiatric:  SLIGHTLY ANXIOUS MOOD, NL AFFECT          Assessment & Plan:

## 2014-07-24 ENCOUNTER — Encounter: Payer: Self-pay | Admitting: Gastroenterology

## 2014-11-24 ENCOUNTER — Telehealth: Payer: Self-pay | Admitting: Gastroenterology

## 2014-11-24 NOTE — Telephone Encounter (Signed)
Routing to [facility] for approval.

## 2014-11-24 NOTE — Telephone Encounter (Signed)
-----   Message -----    From: [name], LPN    Sent: 11/25/7827   3:10 PM      To: Michae Kava, MD, * Subject: Transfer of Care                               Pt called office today and requested to have an appt.with Dr. Jena Gauss to follow up with GERD and Family history of esophageal cancer.  Stated he has seen Dr. Darrick Penna in the past and wanted to switch care to [facility].  Please advise.

## 2014-11-24 NOTE — Telephone Encounter (Signed)
-----   Message from Michael M [place], LPN sent at 11/26/1094  3:10 PM EST ----- Regarding: Transfer of Care Pt called office today and requested to have an appt.with Dr. Jena Gauss to follow up with GERD and Family history of esophageal cancer.  Stated he has seen Dr. Darrick Penna in the past and wanted to switch care to [facility].  Please advise.

## 2014-11-24 NOTE — Telephone Encounter (Signed)
REVIEWED-NO ADDITIONAL RECOMMENDATIONS. 

## 2014-11-26 NOTE — Telephone Encounter (Signed)
Will go along with change if okay with [facility]

## 2014-12-04 NOTE — Telephone Encounter (Signed)
APPOINTMENT MADE AND PATIENT AWARE. °

## 2014-12-04 NOTE — Telephone Encounter (Signed)
Per my email from [facility]:  He will accept pt . Please change [facility] to primary.

## 2014-12-30 ENCOUNTER — Ambulatory Visit (INDEPENDENT_AMBULATORY_CARE_PROVIDER_SITE_OTHER): Payer: PRIVATE HEALTH INSURANCE | Admitting: Internal Medicine

## 2014-12-30 ENCOUNTER — Encounter: Payer: Self-pay | Admitting: Internal Medicine

## 2014-12-30 VITALS — BP 140/91 | HR 85 | Temp 97.9°F | Ht 74.0 in | Wt 261.4 lb

## 2014-12-30 DIAGNOSIS — K219 Gastro-esophageal reflux disease without esophagitis: Secondary | ICD-10-CM

## 2014-12-30 DIAGNOSIS — R1314 Dysphagia, pharyngoesophageal phase: Secondary | ICD-10-CM

## 2014-12-30 NOTE — Patient Instructions (Signed)
Continue omeprazole 20 mg daily (as discussed, the benefits outweigh the risks)  Reduce BMI  GERD information  Office visit in 1 year

## 2014-12-30 NOTE — Progress Notes (Signed)
Primary Care Physician:  Carylon PerchesFAGAN,ROY, MD Primary Gastroenterologist:  Dr. Jena Gaussourk  Pre-Procedure History & Physical: HPI:  Michael LundStephen B Titus is a 54 y.o. male here for follow-up of GERD. Prior EGD by Dr. Darrick PennaFields demonstrated a peptic stricture which was dilated with savory's. No dysphagia these days;  Patient states he might have on average of less than 1 breakthrough episode of reflux weekly on Prilosec 20 mg daily. This is felt to be due to dietary indiscretion. No dysphagia currently. Patient relates dysphagia really got better only after he came off of lisinopril previously. Currently, he is able to swallow pills, bread and meat without any difficulty. No significant tobacco or alcohol exposure. Father did have esophageal cancer.  Past Medical History  Diagnosis Date  . HTN (hypertension)   . Seasonal allergies   . Lumbar herniated disc   . ESOPHAGEAL STRICTURE 04/17/2009    Qualifier: Diagnosis of  By: Minna MerrittsMoore, Amanda      Past Surgical History  Procedure Laterality Date  . Tonsillectomy    . Wisdom tooth extraction    . Colonoscopy N/A 09/20/2013    Procedure: COLONOSCOPY;  Surgeon: West BaliSandi L Fields, MD;  Location: [facility];  Service: Endoscopy;  Laterality: N/A;  8:30  . Esophagogastroduodenoscopy N/A 09/20/2013    Procedure: ESOPHAGOGASTRODUODENOSCOPY (EGD);  Surgeon: West BaliSandi L Fields, MD;  [facility]: [facility];  Service: Endoscopy;  Laterality: N/A;  . Savory dilation N/A 09/20/2013    Procedure: SAVORY DILATION;  Surgeon: West BaliSandi L Fields, MD;  Location: [facility];  Service: Endoscopy;  Laterality: N/A;  Elease Hashimoto. Maloney dilation N/A 09/20/2013    Procedure: Elease HashimotoMALONEY DILATION;  Surgeon: West BaliSandi L Fields, MD;  Location: [facility];  Service: Endoscopy;  Laterality: N/A;  . Esophagogastroduodenoscopy      SLF: Distal esophageal stricture.The area had surrounding mild erythema c/w impacted food bolus which was previously lodged and passed. The distal esophagus was dilated  with the  Savary dilators from 12.8 mm to 16 mm/Normal duodenal bulb and second portion of the duodenum     Prior to Admission medications   Medication Sig Start Date End Date Taking? Authorizing Provider  lisinopril (PRINIVIL,ZESTRIL) 20 MG tablet Take 10 mg by mouth daily.    Yes Historical Provider, MD  Multiple Vitamin (MULTIVITAMIN WITH MINERALS) TABS tablet Take 1 tablet by mouth daily.   Yes Historical Provider, MD  omeprazole (PRILOSEC) 20 MG capsule 1 po bid 30 minutes before meals for 3 mos then once daily FOR 3 MOS. 09/20/13  Yes West BaliSandi L Fields, MD  cetirizine (ZYRTEC) 10 MG tablet Take 10 mg by mouth daily as needed for allergies.     Historical Provider, MD    Allergies as of 12/30/2014  . (No Known Allergies)    Family History  Problem Relation Age of Onset  . Colon cancer Maternal Grandfather     age 7650s  . Diabetes Mother   . Stroke Mother   . Hypertension Mother   . Cancer Father   . Hypertension Father   . Esophageal cancer Father     History   Social History  . Marital Status: Married    Spouse Name: N/A    Number of Children: N/A  . Years of Education: N/A   Occupational History  . Not on file.   Social History Main Topics  . Smoking status: Never Smoker   . Smokeless tobacco: Not on file  . Alcohol Use: No  . Drug Use: No  .  Sexual Activity: Not on file   Other Topics Concern  . Not on file   Social History Narrative    Review of Systems: See HPI, otherwise negative ROS  Physical Exam: BP 140/91 mmHg  Pulse 85  Temp(Src) 97.9 F (36.6 C) (Oral)  Ht  (1.88 m)  Wt 261 lb 6.4 oz (118.57 kg)  BMI 33.55 kg/m2 General:   Alert,  Well-developed, well-nourished, pleasant and cooperative in NAD. Accompanied by spouse Skin:  Intact without significant lesions or rashes. Eyes:  Sclera clear, no icterus.   Conjunctiva pink. Ears:  Normal auditory acuity. Nose:  No deformity, discharge,  or lesions. Mouth:  No deformity or lesions. Neck:  Supple;  no masses or thyromegaly. No significant cervical adenopathy. Lungs:  Clear throughout to auscultation.   No wheezes, crackles, or rhonchi. No acute distress. Heart:  Regular rate and rhythm; no murmurs, clicks, rubs,  or gallops. Abdomen: Non-distended, normal bowel sounds.  Soft and nontender without appreciable mass or hepatosplenomegaly.  Pulses:  Normal pulses noted. Extremities:  Without clubbing or edema.  Impression:  Pleasant 53 year old gentleman with chronic GERD. History of dysphagia and esophageal stricture-dilated previously. Interestingly, he feels dysphagia got better subsequently when he came off of lisinopril. Currently GI symptoms quiescent on Prilosec 20 mg daily.  I reviewed the endoscopic photos from the prior EGD. Photographs depict more of what looks like a ring than a stricture. Either way, symptoms are quiescent at this time. Given dysphagia related to medication exposure as well, there remains a possibility he could have an element of eosinophilic esophagitis.   Recommendations: Continue Prilosec 20 mg daily. No need for EGD at this time. Should he develop recurrent esophageal dysphagia, he is to let me know. Lifestyle changes/diet information provided. Risks and benefits of long-term acid suppression therapy reviewed. Office visit here in one year.     Notice: This dictation was prepared with Dragon dictation along with smaller phrase technology. Any transcriptional errors that result from this process are unintentional and may not be corrected upon review.

## 2015-04-13 ENCOUNTER — Other Ambulatory Visit (HOSPITAL_COMMUNITY): Payer: Self-pay | Admitting: Internal Medicine

## 2015-04-13 DIAGNOSIS — M5410 Radiculopathy, site unspecified: Secondary | ICD-10-CM

## 2015-04-23 ENCOUNTER — Ambulatory Visit (HOSPITAL_COMMUNITY)
Admission: RE | Admit: 2015-04-23 | Discharge: 2015-04-23 | Disposition: A | Discharge status: Home / Self Care | Payer: PRIVATE HEALTH INSURANCE | Source: Ambulatory Visit | Attending: Internal Medicine | Admitting: Internal Medicine

## 2015-04-23 DIAGNOSIS — M5410 Radiculopathy, site unspecified: Secondary | ICD-10-CM

## 2015-05-03 ENCOUNTER — Other Ambulatory Visit: Payer: PRIVATE HEALTH INSURANCE

## 2015-10-27 ENCOUNTER — Other Ambulatory Visit (HOSPITAL_COMMUNITY): Payer: Self-pay | Admitting: Internal Medicine

## 2015-10-27 DIAGNOSIS — R319 Hematuria, unspecified: Secondary | ICD-10-CM

## 2015-10-29 ENCOUNTER — Ambulatory Visit (HOSPITAL_COMMUNITY): Payer: PRIVATE HEALTH INSURANCE

## 2015-12-04 ENCOUNTER — Ambulatory Visit (HOSPITAL_COMMUNITY)
Admission: RE | Admit: 2015-12-04 | Discharge: 2015-12-04 | Disposition: A | Discharge status: Home / Self Care | Payer: PRIVATE HEALTH INSURANCE | Source: Ambulatory Visit | Attending: Internal Medicine | Admitting: Internal Medicine

## 2015-12-04 DIAGNOSIS — R319 Hematuria, unspecified: Secondary | ICD-10-CM

## 2015-12-04 MED ORDER — SODIUM CHLORIDE 0.9 % IV SOLN
INTRAVENOUS | Status: AC
  Filled 2015-12-04: qty 250

## 2015-12-04 MED ORDER — IOHEXOL 300 MG/ML  SOLN: 125.0000 mL | Freq: Once | INTRAMUSCULAR | Status: DC | PRN

## 2015-12-08 ENCOUNTER — Ambulatory Visit (HOSPITAL_COMMUNITY)
Admission: RE | Admit: 2015-12-08 | Discharge: 2015-12-08 | Disposition: A | Discharge status: Home / Self Care | Payer: PRIVATE HEALTH INSURANCE | Source: Ambulatory Visit | Attending: Internal Medicine | Admitting: Internal Medicine

## 2015-12-08 DIAGNOSIS — K760 Fatty (change of) liver, not elsewhere classified: Secondary | ICD-10-CM | POA: Diagnosis not present

## 2015-12-08 DIAGNOSIS — R911 Solitary pulmonary nodule: Secondary | ICD-10-CM | POA: Diagnosis not present

## 2015-12-08 DIAGNOSIS — R319 Hematuria, unspecified: Secondary | ICD-10-CM | POA: Insufficient documentation

## 2015-12-08 DIAGNOSIS — N201 Calculus of ureter: Secondary | ICD-10-CM | POA: Diagnosis not present

## 2015-12-08 DIAGNOSIS — N400 Enlarged prostate without lower urinary tract symptoms: Secondary | ICD-10-CM | POA: Diagnosis not present

## 2015-12-08 MED ORDER — SODIUM CHLORIDE 0.9 % IV SOLN
INTRAVENOUS | Status: AC
  Filled 2015-12-08: qty 250

## 2015-12-08 MED ORDER — IOHEXOL 300 MG/ML  SOLN
150.0000 mL | Freq: Once | INTRAMUSCULAR | Status: AC | PRN
  Administered 2015-12-08: 150 mL via INTRAVENOUS

## 2015-12-15 ENCOUNTER — Encounter: Payer: Self-pay | Admitting: Internal Medicine

## 2015-12-15 ENCOUNTER — Other Ambulatory Visit: Payer: Self-pay | Admitting: Urology

## 2015-12-28 ENCOUNTER — Encounter (HOSPITAL_BASED_OUTPATIENT_CLINIC_OR_DEPARTMENT_OTHER): Payer: Self-pay | Admitting: *Deleted

## 2015-12-29 ENCOUNTER — Encounter (HOSPITAL_BASED_OUTPATIENT_CLINIC_OR_DEPARTMENT_OTHER): Payer: Self-pay | Admitting: *Deleted

## 2015-12-29 NOTE — Progress Notes (Signed)
Pt instructed npo pmn 2/14 x claritin w sip of water.  To [facility] 2/25 @ 0700.  Needs istat on arrival

## 2016-01-05 ENCOUNTER — Encounter: Payer: Self-pay | Admitting: Gastroenterology

## 2016-01-06 ENCOUNTER — Encounter (HOSPITAL_BASED_OUTPATIENT_CLINIC_OR_DEPARTMENT_OTHER)
Admission: RE | Disposition: A | Discharge status: Home / Self Care | Payer: Self-pay | Source: Ambulatory Visit | Attending: Urology

## 2016-01-06 ENCOUNTER — Ambulatory Visit (HOSPITAL_BASED_OUTPATIENT_CLINIC_OR_DEPARTMENT_OTHER): Payer: PRIVATE HEALTH INSURANCE | Admitting: Anesthesiology

## 2016-01-06 ENCOUNTER — Encounter (HOSPITAL_BASED_OUTPATIENT_CLINIC_OR_DEPARTMENT_OTHER): Payer: Self-pay | Admitting: *Deleted

## 2016-01-06 ENCOUNTER — Ambulatory Visit (HOSPITAL_BASED_OUTPATIENT_CLINIC_OR_DEPARTMENT_OTHER)
Admission: RE | Admit: 2016-01-06 | Discharge: 2016-01-06 | Disposition: A | Discharge status: Home / Self Care | Payer: PRIVATE HEALTH INSURANCE | Source: Ambulatory Visit | Attending: Urology | Admitting: Urology

## 2016-01-06 DIAGNOSIS — K219 Gastro-esophageal reflux disease without esophagitis: Secondary | ICD-10-CM | POA: Insufficient documentation

## 2016-01-06 DIAGNOSIS — N138 Other obstructive and reflux uropathy: Secondary | ICD-10-CM | POA: Diagnosis not present

## 2016-01-06 DIAGNOSIS — I10 Essential (primary) hypertension: Secondary | ICD-10-CM | POA: Insufficient documentation

## 2016-01-06 DIAGNOSIS — Z79899 Other long term (current) drug therapy: Secondary | ICD-10-CM | POA: Diagnosis not present

## 2016-01-06 DIAGNOSIS — Z6831 Body mass index (BMI) 31.0-31.9, adult: Secondary | ICD-10-CM | POA: Insufficient documentation

## 2016-01-06 DIAGNOSIS — J45909 Unspecified asthma, uncomplicated: Secondary | ICD-10-CM | POA: Insufficient documentation

## 2016-01-06 DIAGNOSIS — R3915 Urgency of urination: Secondary | ICD-10-CM | POA: Insufficient documentation

## 2016-01-06 DIAGNOSIS — N401 Enlarged prostate with lower urinary tract symptoms: Secondary | ICD-10-CM | POA: Diagnosis not present

## 2016-01-06 DIAGNOSIS — N201 Calculus of ureter: Secondary | ICD-10-CM | POA: Diagnosis present

## 2016-01-06 HISTORY — PX: HOLMIUM LASER APPLICATION: SHX5852

## 2016-01-06 HISTORY — DX: Personal history of other diseases of the digestive system: Z87.19

## 2016-01-06 HISTORY — PX: CYSTOSCOPY WITH RETROGRADE PYELOGRAM, URETEROSCOPY AND STENT PLACEMENT: SHX5789

## 2016-01-06 HISTORY — DX: Calculus of ureter: N20.1

## 2016-01-06 HISTORY — DX: Unspecified osteoarthritis, unspecified site: M19.90

## 2016-01-06 LAB — POCT I-STAT 4, (NA,K, GLUC, HGB,HCT)
GLUCOSE: 110 mg/dL — AB (ref 65–99)
HCT: 43 % (ref 39.0–52.0)
Hemoglobin: 14.6 g/dL (ref 13.0–17.0)
Potassium: 4.1 mmol/L (ref 3.5–5.1)
Sodium: 142 mmol/L (ref 135–145)

## 2016-01-06 SURGERY — CYSTOURETEROSCOPY, WITH RETROGRADE PYELOGRAM AND STENT INSERTION
Anesthesia: General | Site: Ureter | Laterality: Right

## 2016-01-06 MED ORDER — DEXAMETHASONE SODIUM PHOSPHATE 10 MG/ML IJ SOLN
INTRAMUSCULAR | Status: AC
  Filled 2016-01-06: qty 1

## 2016-01-06 MED ORDER — PROMETHAZINE HCL 25 MG/ML IJ SOLN
6.2500 mg | INTRAMUSCULAR | Status: DC | PRN
  Filled 2016-01-06: qty 1

## 2016-01-06 MED ORDER — URELLE 81 MG PO TABS
ORAL_TABLET | ORAL | Status: AC
  Filled 2016-01-06: qty 1

## 2016-01-06 MED ORDER — HYDROCODONE-ACETAMINOPHEN 5-325 MG PO TABS: 1.0000 | ORAL_TABLET | Freq: Four times a day (QID) | ORAL | Status: DC | PRN

## 2016-01-06 MED ORDER — URIBEL 118 MG PO CAPS: 1.0000 | ORAL_CAPSULE | Freq: Three times a day (TID) | ORAL | Status: DC | PRN

## 2016-01-06 MED ORDER — MIDAZOLAM HCL 5 MG/5ML IJ SOLN
INTRAMUSCULAR | Status: DC | PRN
  Administered 2016-01-06: 2 mg via INTRAVENOUS

## 2016-01-06 MED ORDER — FENTANYL CITRATE (PF) 100 MCG/2ML IJ SOLN
25.0000 ug | INTRAMUSCULAR | Status: DC | PRN
  Filled 2016-01-06: qty 1

## 2016-01-06 MED ORDER — PROPOFOL 10 MG/ML IV BOLUS
INTRAVENOUS | Status: DC | PRN
  Administered 2016-01-06: 300 mg via INTRAVENOUS

## 2016-01-06 MED ORDER — MEPERIDINE HCL 25 MG/ML IJ SOLN
6.2500 mg | INTRAMUSCULAR | Status: DC | PRN
  Filled 2016-01-06: qty 1

## 2016-01-06 MED ORDER — ACETAMINOPHEN 500 MG PO TABS
ORAL_TABLET | ORAL | Status: AC
  Filled 2016-01-06: qty 2

## 2016-01-06 MED ORDER — ONDANSETRON HCL 4 MG/2ML IJ SOLN
INTRAMUSCULAR | Status: DC | PRN
  Administered 2016-01-06: 4 mg via INTRAVENOUS

## 2016-01-06 MED ORDER — IOHEXOL 350 MG/ML SOLN
INTRAVENOUS | Status: DC | PRN
  Administered 2016-01-06: 1 mL

## 2016-01-06 MED ORDER — MIDAZOLAM HCL 2 MG/2ML IJ SOLN
0.5000 mg | Freq: Once | INTRAMUSCULAR | Status: DC | PRN
  Filled 2016-01-06: qty 2

## 2016-01-06 MED ORDER — OXYCODONE HCL 5 MG PO TABS
ORAL_TABLET | ORAL | Status: AC
  Filled 2016-01-06: qty 1

## 2016-01-06 MED ORDER — KETOROLAC TROMETHAMINE 30 MG/ML IJ SOLN
INTRAMUSCULAR | Status: DC | PRN
  Administered 2016-01-06: 30 mg via INTRAVENOUS

## 2016-01-06 MED ORDER — KETOROLAC TROMETHAMINE 30 MG/ML IJ SOLN
INTRAMUSCULAR | Status: AC
  Filled 2016-01-06: qty 1

## 2016-01-06 MED ORDER — FENTANYL CITRATE (PF) 100 MCG/2ML IJ SOLN
INTRAMUSCULAR | Status: DC | PRN
  Administered 2016-01-06: 50 ug via INTRAVENOUS

## 2016-01-06 MED ORDER — LIDOCAINE HCL (CARDIAC) 20 MG/ML IV SOLN
INTRAVENOUS | Status: DC | PRN
  Administered 2016-01-06: 100 mg via INTRAVENOUS

## 2016-01-06 MED ORDER — DEXAMETHASONE SODIUM PHOSPHATE 4 MG/ML IJ SOLN
INTRAMUSCULAR | Status: DC | PRN
  Administered 2016-01-06: 10 mg via INTRAVENOUS

## 2016-01-06 MED ORDER — MIDAZOLAM HCL 2 MG/2ML IJ SOLN
INTRAMUSCULAR | Status: AC
  Filled 2016-01-06: qty 2

## 2016-01-06 MED ORDER — HYDROCODONE-ACETAMINOPHEN 5-325 MG PO TABS
1.0000 | ORAL_TABLET | Freq: Four times a day (QID) | ORAL | Status: DC | PRN
  Filled 2016-01-06: qty 1

## 2016-01-06 MED ORDER — ACETAMINOPHEN 325 MG PO TABS
ORAL_TABLET | ORAL | Status: DC | PRN
  Administered 2016-01-06: 1000 mg via ORAL

## 2016-01-06 MED ORDER — LIDOCAINE HCL (CARDIAC) 20 MG/ML IV SOLN
INTRAVENOUS | Status: AC
  Filled 2016-01-06: qty 5

## 2016-01-06 MED ORDER — LIDOCAINE HCL 2 % EX GEL
CUTANEOUS | Status: DC | PRN
  Administered 2016-01-06: 1 via URETHRAL

## 2016-01-06 MED ORDER — LACTATED RINGERS IV SOLN
INTRAVENOUS | Status: DC
  Administered 2016-01-06: 08:00:00 via INTRAVENOUS
  Filled 2016-01-06: qty 1000

## 2016-01-06 MED ORDER — ONDANSETRON HCL 4 MG/2ML IJ SOLN
INTRAMUSCULAR | Status: AC
  Filled 2016-01-06: qty 2

## 2016-01-06 MED ORDER — URELLE 81 MG PO TABS
1.0000 | ORAL_TABLET | Freq: Four times a day (QID) | ORAL | Status: DC
  Administered 2016-01-06: 81 mg via ORAL
  Filled 2016-01-06: qty 1

## 2016-01-06 MED ORDER — CEFAZOLIN SODIUM-DEXTROSE 2-3 GM-% IV SOLR
2.0000 g | INTRAVENOUS | Status: AC
  Administered 2016-01-06: 2 g via INTRAVENOUS
  Filled 2016-01-06: qty 50

## 2016-01-06 MED ORDER — PROPOFOL 10 MG/ML IV BOLUS
INTRAVENOUS | Status: AC
  Filled 2016-01-06: qty 40

## 2016-01-06 MED ORDER — OXYCODONE HCL 5 MG PO TABS
5.0000 mg | ORAL_TABLET | Freq: Once | ORAL | Status: AC
  Administered 2016-01-06: 5 mg via ORAL
  Filled 2016-01-06: qty 1

## 2016-01-06 MED ORDER — CEFAZOLIN SODIUM-DEXTROSE 2-3 GM-% IV SOLR
INTRAVENOUS | Status: AC
  Filled 2016-01-06: qty 50

## 2016-01-06 MED ORDER — FENTANYL CITRATE (PF) 100 MCG/2ML IJ SOLN
INTRAMUSCULAR | Status: AC
  Filled 2016-01-06: qty 2

## 2016-01-06 MED ORDER — SODIUM CHLORIDE 0.9 % IR SOLN
Status: DC | PRN
  Administered 2016-01-06: 2000 mL
  Administered 2016-01-06: 1000 mL

## 2016-01-06 SURGICAL SUPPLY — 41 items
ADAPTER CATH URET PLST 4-6FR (CATHETERS) IMPLANT
ADPR CATH URET STRL DISP 4-6FR (CATHETERS)
BAG DRAIN URO-CYSTO SKYTR STRL (DRAIN) ×4 IMPLANT
BASKET LASER NITINOL 1.9FR (BASKET) IMPLANT
BASKET STNLS GEMINI 4WIRE 3FR (BASKET) IMPLANT
BASKET ZERO TIP NITINOL 2.4FR (BASKET) ×4 IMPLANT
BENZOIN TINCTURE PRP APPL 2/3 (GAUZE/BANDAGES/DRESSINGS) IMPLANT
CANISTER SUCT LVC 12 LTR MEDI- (MISCELLANEOUS) IMPLANT
CATH INTERMIT  6FR 70CM (CATHETERS) IMPLANT
CATH URET 5FR 28IN CONE TIP (BALLOONS)
CATH URET 5FR 28IN OPEN ENDED (CATHETERS) ×4 IMPLANT
CATH URET 5FR 70CM CONE TIP (BALLOONS) IMPLANT
CLOTH BEACON ORANGE TIMEOUT ST (SAFETY) ×4 IMPLANT
DRSG TEGADERM 2-3/8X2-3/4 SM (GAUZE/BANDAGES/DRESSINGS) IMPLANT
FIBER LASER FLEXIVA 1000 (UROLOGICAL SUPPLIES) IMPLANT
FIBER LASER FLEXIVA 365 (UROLOGICAL SUPPLIES) ×4 IMPLANT
FIBER LASER FLEXIVA 550 (UROLOGICAL SUPPLIES) IMPLANT
FIBER LASER TRAC TIP (UROLOGICAL SUPPLIES) IMPLANT
GLOVE BIO SURGEON STRL SZ 6.5 (GLOVE) ×3 IMPLANT
GLOVE BIO SURGEON STRL SZ7.5 (GLOVE) ×4 IMPLANT
GLOVE BIO SURGEONS STRL SZ 6.5 (GLOVE) ×1
GLOVE INDICATOR 6.5 STRL GRN (GLOVE) ×8 IMPLANT
GOWN STRL REUS W/ TWL XL LVL3 (GOWN DISPOSABLE) ×2 IMPLANT
GOWN STRL REUS W/TWL XL LVL3 (GOWN DISPOSABLE) ×2
GUIDEWIRE 0.038 PTFE COATED (WIRE) IMPLANT
GUIDEWIRE ANG ZIPWIRE 038X150 (WIRE) IMPLANT
GUIDEWIRE STR DUAL SENSOR (WIRE) IMPLANT
IV NS IRRIG 3000ML ARTHROMATIC (IV SOLUTION) ×8 IMPLANT
KIT BALLIN UROMAX 15FX10 (LABEL) IMPLANT
KIT BALLN UROMAX 15FX4 (MISCELLANEOUS) IMPLANT
KIT BALLN UROMAX 26 75X4 (MISCELLANEOUS)
KIT ROOM TURNOVER WOR (KITS) ×4 IMPLANT
MANIFOLD NEPTUNE II (INSTRUMENTS) IMPLANT
NS IRRIG 500ML POUR BTL (IV SOLUTION) IMPLANT
PACK CYSTO (CUSTOM PROCEDURE TRAY) ×4 IMPLANT
SET HIGH PRES BAL DIL (LABEL)
SHEATH ACCESS URETERAL 24CM (SHEATH) ×4 IMPLANT
SHEATH ACCESS URETERAL 38CM (SHEATH) IMPLANT
STENT URET 6FRX26 CONTOUR (STENTS) ×4 IMPLANT
TUBE CONNECTING 12'X1/4 (SUCTIONS)
TUBE CONNECTING 12X1/4 (SUCTIONS) IMPLANT

## 2016-01-06 NOTE — Anesthesia Preprocedure Evaluation (Addendum)
Anesthesia Evaluation  Patient identified by MRN, date of birth, ID band Patient awake    Reviewed: Allergy & Precautions, NPO status , Patient's Chart, lab work & pertinent test results  History of Anesthesia Complications Negative for: history of anesthetic complications  Airway Mallampati: II  TM Distance: >3 FB Neck ROM: Full    Dental  (+) Dental Advisory Given, Chipped   Pulmonary neg pulmonary ROS,    breath sounds clear to auscultation       Cardiovascular hypertension, Pt. on medications (-) angina Rhythm:Regular Rate:Normal     Neuro/Psych negative neurological ROS  negative psych ROS   GI/Hepatic Neg liver ROS, GERD  Medicated and Controlled,  Endo/Other  Morbid obesity  Renal/GU Kidney stone     Musculoskeletal   Abdominal (+) + obese,   Peds  Hematology negative hematology ROS (+)   Anesthesia Other Findings   Reproductive/Obstetrics                            Anesthesia Physical Anesthesia Plan  ASA: II  Anesthesia Plan: General   Post-op Pain Management:    Induction: Intravenous  Airway Management Planned: LMA  Additional Equipment:   Intra-op Plan:   Post-operative Plan:   Informed Consent: I have reviewed the patients History and Physical, chart, labs and discussed the procedure including the risks, benefits and alternatives for the proposed anesthesia with the patient or authorized representative who has indicated his/her understanding and acceptance.   Dental advisory given  Plan Discussed with: CRNA and Surgeon  Anesthesia Plan Comments: (Plan routine monitors, GA- LMA OK)        Anesthesia Quick Evaluation

## 2016-01-06 NOTE — Op Note (Signed)
Preoperative diagnosis: right ureteral calculus  Postoperative diagnosis: right ureteral calculus  Procedure:  1. Cystoscopy 2. right ureteroscopy and stone removal 3. Ureteroscopic laser lithotripsy 4. right ureteral stent placement (26cm) 63F 5. Right retrograde pyelography with interpretation  Surgeon: Valetta Fuller, MD  Anesthesia: General  Complications: None  Intraoperative findings: Right retrograde pyelography demonstrated a filling defect within the Right ureter consistent with the patient's known calculus without other abnormalities.  EBL: Minimal  Specimens: 1. Right ureteral calculus  Disposition of specimens: [facility] for stone analysis  Indication: Michael Cochran is a 55 y.o.   patient with urolithiasis. After reviewing the management options for treatment, the patient elected to proceed with the above surgical procedure(s). We have discussed the potential benefits and risks of the procedure, side effects of the proposed treatment, the likelihood of the patient achieving the goals of the procedure, and any potential problems that might occur during the procedure or recuperation. Informed consent has been obtained.  Description of procedure:  The patient was taken to the operating room and general anesthesia was induced.  The patient was placed in the dorsal lithotomy position, prepped and draped in the usual sterile fashion, and preoperative antibiotics were administered. A preoperative time-out was performed.   Cystourethroscopy was performed.  The patient's urethra was examined and was normal/ demonstrated bilobar prostatic hypertrophy. The bladder was then systematically examined in its entirety. There was no evidence for any bladder tumors, stones, or other mucosal pathology.    Attention then turned to the Right ureteral orifice and a ureteral catheter was used to intubate the ureteral orifice.  Omnipaque contrast was injected through the  ureteral catheter and a retrograde pyelogram was performed with findings as dictated above.  A 0.38 sensor guidewire was then advanced up the Right ureter into the renal pelvis under fluoroscopic guidance. The 6 Fr semirigid ureteroscope was then advanced into the ureter next to the guidewire and the calculus was identified.   The stone was then fragmented with the 365 micron holmium laser fiber on a setting of 0.5J and frequency of 5 Hz.   All stones were then removed from the ureter with a zero tip nitinol basket.  Reinspection of the ureter revealed no remaining visible stones or fragments.   The wire was then backloaded through the cystoscope and a ureteral stent was advance over the wire using Seldinger technique.  The stent was positioned appropriately under fluoroscopic and cystoscopic guidance.  The wire was then removed with an adequate stent curl noted in the renal pelvis as well as in the bladder.  The bladder was then emptied and the procedure ended.  The patient appeared to tolerate the procedure well and without complications.  The patient was able to be awakened and transferred to the recovery unit in satisfactory condition.

## 2016-01-06 NOTE — Anesthesia Procedure Notes (Signed)
Procedure Name: LMA Insertion Date/Time: 01/06/2016 8:44 AM Performed by: Maris Berger T Pre-anesthesia Checklist: Patient identified, Emergency Drugs available, Suction available and Patient being monitored Patient Re-evaluated:Patient Re-evaluated prior to inductionOxygen Delivery Method: Circle System Utilized Preoxygenation: Pre-oxygenation with 100% oxygen Intubation Type: IV induction Ventilation: Mask ventilation without difficulty LMA: LMA inserted LMA Size: 5.0 Number of attempts: 1 Airway Equipment and Method: Bite block Placement Confirmation: positive ETCO2 Dental Injury: Teeth and Oropharynx as per pre-operative assessment

## 2016-01-06 NOTE — H&P (Signed)
History of Present Illness   Michael Cochran presents today as a referral from Dr Carylon Perches for a recently diagnosed ureteral stone. Michael Cochran is currently 55 years of age. He really has had no prior urologic history other than prior vasectomy. For several years he has had some voiding issues that do bother him. He has had progressive urinary frequency and urgency. He intermittently has some dysuria. He has also had some urge incontinence along with some hesitancy and intermittency. He has had some chronic back issues and apparently is known to have a herniated disc. He is under the care of a chiropractor as well as a neurosurgeon and has been contemplating surgical intervention. He has not had definitive issues with renal colic. He was noted to have significant microhematuria and was sent for a CT with IV contrast. He had minimal obstruction but was noted to have a 7 mm stone approximately a centimeter above his ureterovesical junction. There was mild dilation of the proximal ureter but again no gross evidence of significant hydronephrosis. Urine pH has been in the 6 to 6.5 range. Today's urine does not show any ongoing microhematuria.      Past Medical History Problems  1. History of asthma (Z87.09) 2. History of heartburn (Z87.898) 3. History of hypertension (Z86.79)  Surgical History Problems  1. History of Surgery Vas Deferens Vasectomy  Current Meds 1. Aleve CAPS; AS NEEDED;  Therapy: (Recorded:23Jan2017) to Recorded 2. [employer] Digestive Probiotic 250 MG Oral Capsule;  Therapy: (Recorded:23Jan2017) to Recorded 3. Losartan Potassium 25 MG Oral Tablet;  Therapy: (Recorded:23Jan2017) to Recorded 4. Tart Cherry Advanced Oral Capsule;  Therapy: (Recorded:23Jan2017) to Recorded  Allergies Medication  1. No Known Drug Allergies  Family History Problems  1. Family history of diabetes mellitus (Z83.3) : Mother 2. Family history of malignant neoplasm of esophagus (Z80.0) : Father 3. Family history of  prostate cancer (Z80.42) : Father  Social History Problems    Denied: History of Alcohol use   Denied: History of Caffeine use   Father deceased   dec age 62 yo from esophageal cancer   Married   Never a smoker   Number of children   son 29yo   Occupation   Sport and exercise psychologist   Patient's mother is still living   age 53 yo  Review of Systems Genitourinary, constitutional, skin, eye, otolaryngeal, hematologic/lymphatic, cardiovascular, pulmonary, endocrine, musculoskeletal, gastrointestinal, neurological and psychiatric system(s) were reviewed and pertinent findings if present are noted and are otherwise negative.  Genitourinary: urinary frequency, feelings of urinary urgency, dysuria, incontinence, difficulty starting the urinary stream, weak urinary stream and initiating urination requires straining.    Vitals Vital Signs [Data Includes: Last 1 Day]  Recorded: 23Jan2017 10:10AM  Height: 6 ft 2 in Weight: 261 lb  BMI Calculated: 33.51 BSA Calculated: 2.43 Blood Pressure: 150 / 94 Temperature: 98.1 F Heart Rate: 81  Physical Exam Constitutional: Well nourished and well developed . No acute distress.  ENT:. The ears and nose are normal in appearance.  Neck: The appearance of the neck is normal and no neck mass is present.  Pulmonary: No respiratory distress and normal respiratory rhythm and effort.  Cardiovascular: Heart rate and rhythm are normal . No peripheral edema.  Abdomen: The abdomen is soft and nontender. No masses are palpated. No CVA tenderness. No hernias are palpable. No hepatosplenomegaly noted.  Genitourinary: Examination of the penis demonstrates no discharge, no masses, no lesions and a normal meatus. The scrotum is without lesions. The right epididymis is palpably normal  and non-tender. The left epididymis is palpably normal and non-tender. The right testis is non-tender and without masses. The left testis is non-tender and without masses.  Skin: Normal  skin turgor, no visible rash and no visible skin lesions.  Neuro/Psych:. Mood and affect are appropriate.    Results/Data Urine [Data Includes: Last 1 Day]   23Jan2017  COLOR AMBER   APPEARANCE CLEAR   SPECIFIC GRAVITY 1.020   pH 6.5   GLUCOSE NEGATIVE   BILIRUBIN NEGATIVE   KETONE NEGATIVE   BLOOD NEGATIVE   PROTEIN NEGATIVE   NITRITE NEGATIVE   LEUKOCYTE ESTERASE NEGATIVE     On KUB imaging today, the patient does have what appears to be an approximately 6 mm fairly dense-appearing stone in the right hemipelvis. I have also reviewed CT scan from [facility] and this is the same stone noted at that time.    Assessment Assessed  1. Ureteral calculus (N20.1) 2. BPH (benign prostatic hypertrophy) with urinary obstruction (N40.1,N13.8) 3. Urinary urgency (R39.15)  Plan Health Maintenance  1. UA With REFLEX; [Do Not Release]; Status:Complete;   Done: 23Jan2017 09:51AM Ureteral calculus  2. Start: Tamsulosin HCl - 0.4 MG Oral Capsule; TAKE 1 CAPSULE EVERY DAY 3. Follow-up Schedule Surgery Office  Follow-up  Status: Hold For - Appointment   Requested for: 23Jan2017 4. KUB; Status:Resulted - Requires Verification;   Done: 23Jan2017 10:35AM  Discussion/Summary   Michael Cochran has a fairly dense 6 mm distal right ureteral stone. He is relatively asymptomatic and does not appear to have any significant obstruction. I suspect the stone has been there for quite some time. I do recommend that we strongly consider intervention and feel that a ureteroscopy with Holmium Laser lithotripsy is a better option for him than ESWL. I did discuss the nature of that procedure. We feel that there is about a 50/50 chance that he may require temporary double J stent placement. Overall success rate for ureteroscopy for a distal stone should be 95% plus. There is no urgency to this but we will try to get this scheduled for sometime in the next couple of weeks. I would like to put Michael Cochran on an alpha blocker. This  would potentially facilitate passage of the stone to give him some increased chance of passing this spontaneously prior to the intervention, but also hopefully it will help his voiding symptoms. It is hard to know how much of his voiding symptoms are related to the distal stone versus just outlet obstruction from BPH.   cc: Carylon Perches, MD   Signatures Electronically signed by : Barron Alvine, M.D.; Dec 14 2015  4:23PM EST

## 2016-01-06 NOTE — Discharge Instructions (Addendum)
DISCHARGE INSTRUCTIONS FOR KIDNEY STONES OR URETERAL STENT  MEDICATIONS:   1. DO NOT RESUME YOUR ASPIRIN, or any other medicines like ibuprofen, motrin, excedrin, advil, aleve, vitamin E, fish oil as these can all cause bleeding x 7 days.  2. Resume all your other meds from home - except do not take any other pain meds that you may have at home.  ACTIVITY 1. No strenuous activity x 1week 2. No driving while on narcotic pain medications 3. Drink plenty of water 4. Continue to walk at home - you can still get blood clots when you are at home, so keep active, but don't over do it. 5. May return to work in 3 days.  BATHING 1. You can shower and we recommend daily showers  2. If you have a string coming from your urethra:  The stent string is attached to your ureteral stent.  Do not pull on this.   SIGNS/SYMPTOMS TO CALL: 1. Please call us if you have a fever greater than 101.5, uncontrolled  nausea/vomiting, uncontrolled pain, dizziness, unable to urinate, bloody urine, chest pain, shortness of breath, leg swelling, leg pain, redness around wound, drainage from wound, or any other concerns or questions.  You can reach [place] at [phone].  FOLLOW-UP 1. You will have to call to get a follow up in about 3 weeks 2. If you have a string attached to your stent, you may remove it on Sunday AM.  To do this, pull the string until the stent is completely removed.  You may feel an odd sensation in your back. 3.   Post Anesthesia Home Care Instructions  Activity: Get plenty of rest for the remainder of the day. A responsible adult should stay with you for 24 hours following the procedure.  For the next 24 hours, DO NOT: -Drive a car -Advertising copywriter -Drink alcoholic beverages -Take any medication unless instructed by your physician -Make any legal decisions or sign important papers.  Meals: Start with liquid foods such as gelatin or soup. Progress to regular foods as tolerated. Avoid  greasy, spicy, heavy foods. If nausea and/or vomiting occur, drink only clear liquids until the nausea and/or vomiting subsides. Call your physician if vomiting continues.  Special Instructions/Symptoms: Your throat may feel dry or sore from the anesthesia or the breathing tube placed in your throat during surgery. If this causes discomfort, gargle with warm salt water. The discomfort should disappear within 24 hours.  If you had a scopolamine patch placed behind your ear for the management of post- operative nausea and/or vomiting:  1. The medication in the patch is effective for 72 hours, after which it should be removed.  Wrap patch in a tissue and discard in the trash. Wash hands thoroughly with soap and water. 2. You may remove the patch earlier than 72 hours if you experience unpleasant side effects which may include dry mouth, dizziness or visual disturbances. 3. Avoid touching the patch. Wash your hands with soap and water after contact with the patch.

## 2016-01-06 NOTE — Interval H&P Note (Signed)
History and Physical Interval Note:  01/06/2016 8:32 AM  Michael Cochran  has presented today for surgery, with the diagnosis of RIGHT URETERAL CALCULUS  The various methods of treatment have been discussed with the patient and family. After consideration of risks, benefits and other options for treatment, the patient has consented to  Procedure(s): CYSTOSCOPY WITH RETROGRADE PYELOGRAM, URETEROSCOPY AND POSSIBLE STENT PLACEMENT (Right) HOLMIUM LASER APPLICATION (Right) as a surgical intervention .  The patient's history has been reviewed, patient examined, no change in status, stable for surgery.  I have reviewed the patient's chart and labs.  Questions were answered to the patient's satisfaction.     [name]

## 2016-01-06 NOTE — Anesthesia Postprocedure Evaluation (Signed)
Anesthesia Post Note  Patient: Michael Cochran  Procedure(s) Performed: Procedure(s) (LRB): CYSTOSCOPY WITH RETROGRADE PYELOGRAM, URETEROSCOPY AND POSSIBLE STENT PLACEMENT (Right) HOLMIUM LASER APPLICATION (Right)  Patient location during evaluation: PACU Anesthesia Type: General Level of consciousness: awake and alert, oriented and patient cooperative Pain management: pain level controlled Vital Signs Assessment: post-procedure vital signs reviewed and stable Respiratory status: spontaneous breathing, nonlabored ventilation and respiratory function stable Cardiovascular status: blood pressure returned to baseline and stable Postop Assessment: no signs of nausea or vomiting Anesthetic complications: no    Last Vitals:  Filed Vitals:   01/06/16 1000 01/06/16 1011  BP: 117/84 126/87  Pulse: 59 61  Temp:    Resp: 14 15    Last Pain:  Filed Vitals:   01/06/16 1011  PainSc: Asleep                 [name],E. Ryan Palermo

## 2016-01-06 NOTE — Transfer of Care (Signed)
Immediate Anesthesia Transfer of Care Note  Patient: Michael Cochran  Procedure(s) Performed: Procedure(s): CYSTOSCOPY WITH RETROGRADE PYELOGRAM, URETEROSCOPY AND POSSIBLE STENT PLACEMENT (Right) HOLMIUM LASER APPLICATION (Right)  Patient Location: PACU  Anesthesia Type:General  Level of Consciousness: awake and oriented  Airway & Oxygen Therapy: Patient Spontanous Breathing and Patient connected to nasal cannula oxygen  Post-op Assessment: Report given to RN  Post vital signs: Reviewed and stable  Last Vitals:  Filed Vitals:   01/06/16 0719 01/06/16 0930  BP: 139/94 122/81  Pulse: 74 75  Temp: 36.5 C 36.6 C  Resp: 16 10    Complications: No apparent anesthesia complications

## 2016-01-07 ENCOUNTER — Encounter (HOSPITAL_BASED_OUTPATIENT_CLINIC_OR_DEPARTMENT_OTHER): Payer: Self-pay | Admitting: Urology

## 2016-02-23 ENCOUNTER — Other Ambulatory Visit: Payer: Self-pay | Admitting: Internal Medicine

## 2016-02-23 DIAGNOSIS — M5416 Radiculopathy, lumbar region: Secondary | ICD-10-CM

## 2016-02-23 DIAGNOSIS — M545 Low back pain: Secondary | ICD-10-CM

## 2016-03-08 ENCOUNTER — Ambulatory Visit
Admission: RE | Admit: 2016-03-08 | Discharge: 2016-03-08 | Disposition: A | Discharge status: Home / Self Care | Payer: PRIVATE HEALTH INSURANCE | Source: Ambulatory Visit | Attending: Internal Medicine | Admitting: Internal Medicine

## 2016-03-08 DIAGNOSIS — M5416 Radiculopathy, lumbar region: Secondary | ICD-10-CM

## 2016-10-17 ENCOUNTER — Ambulatory Visit (HOSPITAL_COMMUNITY)
Admission: RE | Admit: 2016-10-17 | Discharge: 2016-10-17 | Disposition: A | Discharge status: Home / Self Care | Payer: PRIVATE HEALTH INSURANCE | Source: Ambulatory Visit | Attending: Internal Medicine | Admitting: Internal Medicine

## 2016-10-17 ENCOUNTER — Other Ambulatory Visit (HOSPITAL_COMMUNITY): Payer: Self-pay | Admitting: Internal Medicine

## 2016-10-17 DIAGNOSIS — M79674 Pain in right toe(s): Secondary | ICD-10-CM

## 2016-10-17 DIAGNOSIS — M79671 Pain in right foot: Secondary | ICD-10-CM

## 2016-10-17 DIAGNOSIS — S92511A Displaced fracture of proximal phalanx of right lesser toe(s), initial encounter for closed fracture: Secondary | ICD-10-CM | POA: Diagnosis not present

## 2016-10-17 DIAGNOSIS — X58XXXA Exposure to other specified factors, initial encounter: Secondary | ICD-10-CM | POA: Diagnosis not present

## 2016-10-19 ENCOUNTER — Ambulatory Visit (INDEPENDENT_AMBULATORY_CARE_PROVIDER_SITE_OTHER): Payer: PRIVATE HEALTH INSURANCE | Admitting: Orthopaedic Surgery

## 2016-10-19 VITALS — BP 141/86 | HR 85 | Ht 72.0 in | Wt 259.0 lb

## 2016-10-19 DIAGNOSIS — S92501A Displaced unspecified fracture of right lesser toe(s), initial encounter for closed fracture: Secondary | ICD-10-CM | POA: Diagnosis not present

## 2016-10-19 NOTE — Progress Notes (Signed)
Subjective:    Patient ID: Michael Cochran, male    DOB: Apr 20, 1961, 55 y.o.   MRN: [id number]  HPI  He caught his toe on a bucket handles and hurt his right little toe on 10-17-16.  He was seen by Dr. Ouida SillsFagan and had x-rays.  X-rays showed: IMPRESSION: Mildly impacted slightly angulated fracture of the base of the shaft of the proximal phalanx of the right fifth toe. There is overlying soft tissue swelling.  He has little pain.  He is using regular shoes.  He is walking well.  I have shown him the x-rays and explained the findings.  It will take about six weeks for this to heal.  I have gone over precautions with him.  He appears to understand.    Review of Systems  HENT: Negative for congestion.   Respiratory: Negative for cough and shortness of breath.   Cardiovascular: Negative for chest pain and leg swelling.  Endocrine: Negative for cold intolerance.  Musculoskeletal: Positive for arthralgias and gait problem.  Allergic/Immunologic: Positive for environmental allergies.   Past Medical History:  Diagnosis Date  . Arthritis    gouty  . History of esophageal stricture    W/ DILATION  . HTN (hypertension)   . 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 Right ureteral stone   . Seasonal allergies     Past Surgical History:  Procedure Laterality Date  . COLONOSCOPY N/A 09/20/2013   Procedure: COLONOSCOPY;  Surgeon: West BaliSandi L Fields, MD;  Location: [facility];  Service: Endoscopy;  Laterality: N/A;  8:30  . CYSTOSCOPY WITH RETROGRADE PYELOGRAM, URETEROSCOPY AND STENT PLACEMENT Right 01/06/2016   Procedure: CYSTOSCOPY WITH RETROGRADE PYELOGRAM, URETEROSCOPY AND POSSIBLE STENT PLACEMENT;  Surgeon: Barron Alvineavid Grapey, MD;  [facility];  Service: Urology;  Laterality: Right;  . ESOPHAGOGASTRODUODENOSCOPY N/A 09/20/2013   Procedure: ESOPHAGOGASTRODUODENOSCOPY (EGD);  Surgeon: West BaliSandi L Fields, MD;  Location: [facility];  Service: Endoscopy;  Laterality: N/A;  .  ESOPHAGOGASTRODUODENOSCOPY     SLF: Distal esophageal stricture.The area had surrounding mild erythema c/w impacted food bolus which was previously lodged and passed. The distal esophagus was dilated  with the Savary dilators from 12.8 mm to 16 mm/Normal duodenal bulb and second portion of the duodenum   . HOLMIUM LASER APPLICATION Right 01/06/2016   Procedure: HOLMIUM LASER APPLICATION;  Surgeon: Barron Alvineavid Grapey, MD;  [facility];  Service: Urology;  Laterality: Right;  . MALONEY DILATION N/A 09/20/2013   Procedure: MALONEY DILATION;  Surgeon: West BaliSandi L Fields, MD;  [facility]: [facility];  Service: Endoscopy;  Laterality: N/A;  . SAVORY DILATION N/A 09/20/2013   Procedure: SAVORY DILATION;  Surgeon: West BaliSandi L Fields, MD;  Location: [facility];  Service: Endoscopy;  Laterality: N/A;  . TONSILLECTOMY    . VASECTOMY    . WISDOM TOOTH EXTRACTION      Current Outpatient Prescriptions on File Prior to Visit  Medication Sig Dispense Refill  . cetirizine (ZYRTEC) 10 MG tablet Take 10 mg by mouth daily as needed for allergies.     Marland Kitchen. glucosamine-chondroitin 500-400 MG tablet Take 1 tablet by mouth daily.    Marland Kitchen. HYDROcodone-acetaminophen (NORCO/VICODIN) 5-325 MG tablet Take 1-2 tablets by mouth every 6 (six) hours as needed. 20 tablet 0  . lisinopril (PRINIVIL,ZESTRIL) 20 MG tablet Take 10 mg by mouth daily.     Marland Kitchen. loratadine-pseudoephedrine (CLARITIN-D 24-HOUR) 10-240 MG 24 hr tablet Take 1 tablet by mouth daily.    .Marland Kitchen  losartan (COZAAR) 25 MG tablet Take 25 mg by mouth every morning.    . Meth-Hyo-M Bl-Na Phos-Ph Sal (URIBEL) 118 MG CAPS Take 1 capsule (118 mg total) by mouth 3 (three) times daily as needed. 15 capsule 1  . Multiple Vitamin (MULTIVITAMIN WITH MINERALS) TABS tablet Take 1 tablet by mouth daily.    . naproxen sodium (ANAPROX) 220 MG tablet Take 220 mg by mouth 2 (two) times daily as needed.    Marland Kitchen. omeprazole (PRILOSEC) 20 MG capsule 1 po bid 30 minutes before meals for 3  mos then once daily FOR 3 MOS. 62 capsule 11  . tamsulosin (FLOMAX) 0.4 MG CAPS capsule Take 0.4 mg by mouth daily.     No current facility-administered medications on file prior to visit.     Social History   Social History  . Marital status: Married    Spouse name: N/A  . Number of children: N/A  . Years of education: N/A   Occupational History  . Not on file.   Social History Main Topics  . Smoking status: Never Smoker  . Smokeless tobacco: Not on file  . Alcohol use No  . Drug use: No  . Sexual activity: Not on file   Other Topics Concern  . Not on file   Social History Narrative  . No narrative on file    Family History  Problem Relation Age of Onset  . Colon cancer Maternal Grandfather     age 450s  . Diabetes Mother   . Stroke Mother   . Hypertension Mother   . Cancer Father   . Hypertension Father   . Esophageal cancer Father     BP (!) 141/86   Pulse 85   Ht 6' (1.829 m)   Wt 259 lb (117.5 kg)   BMI 35.13 kg/m      Objective:   Physical Exam  Constitutional: He is oriented to person, place, and time. He appears well-developed and well-nourished.  HENT:  Head: Normocephalic and atraumatic.  Eyes: Conjunctivae and EOM are normal. Pupils are equal, round, and reactive to light.  Neck: Normal range of motion. Neck supple.  Cardiovascular: Normal rate, regular rhythm and intact distal pulses.   Pulmonary/Chest: Effort normal.  Abdominal: Soft.  Musculoskeletal: He exhibits tenderness (Slight pain of the right little toe, slight swelling, no deformity.  NV intact.  Gait normal.  Left foot negative.).  Neurological: He is alert and oriented to person, place, and time. He has normal reflexes. He displays normal reflexes. No cranial nerve deficit. He exhibits normal muscle tone. Coordination normal.  Skin: Skin is warm and dry.  Psychiatric: He has a normal mood and affect. His behavior is normal. Judgment and thought content normal.           Assessment & Plan:   Encounter Diagnosis  Name Primary?  . Fracture of fifth toe, right, closed, initial encounter Yes   Precautions discussed.  Return in three weeks.  X-rays of the right little toe on return.  Call if any problems.  Electronically Signed Darreld McleanWayne Chrishawna Farina, MD 11/29/201710:56 AM

## 2016-11-09 ENCOUNTER — Ambulatory Visit (INDEPENDENT_AMBULATORY_CARE_PROVIDER_SITE_OTHER): Payer: PRIVATE HEALTH INSURANCE

## 2016-11-09 ENCOUNTER — Ambulatory Visit (INDEPENDENT_AMBULATORY_CARE_PROVIDER_SITE_OTHER): Payer: PRIVATE HEALTH INSURANCE | Admitting: Orthopaedic Surgery

## 2016-11-09 DIAGNOSIS — S92501D Displaced unspecified fracture of right lesser toe(s), subsequent encounter for fracture with routine healing: Secondary | ICD-10-CM

## 2016-11-09 NOTE — Progress Notes (Signed)
Patient UX:[id number]:Michael Cochran, male DOB:24-Jun-1961, 55 y.o. UVO:[id number]  Chief Complaint  Patient presents with  . Follow-up    Fracture care    HPI  Michael LundStephen B Mousel is a 55 y.o. male who broke his little toe in mid November.  He is doing well.  He has little pain.  He is wearing regular shoes.  He is walking well. HPI  There is no height or weight on file to calculate BMI.  ROS  Review of Systems  HENT: Negative for congestion.   Respiratory: Negative for cough and shortness of breath.   Cardiovascular: Negative for chest pain and leg swelling.  Endocrine: Negative for cold intolerance.  Musculoskeletal: Positive for arthralgias and gait problem.  Allergic/Immunologic: Positive for environmental allergies.    Past Medical History:  Diagnosis Date  . Arthritis    gouty  . History of esophageal stricture    W/ DILATION  . HTN (hypertension)   . 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 Right ureteral stone   . Seasonal allergies     Past Surgical History:  Procedure Laterality Date  . COLONOSCOPY N/A 09/20/2013   Procedure: COLONOSCOPY;  Surgeon: West BaliSandi L Fields, MD;  [facility];  Service: Endoscopy;  Laterality: N/A;  8:30  . CYSTOSCOPY WITH RETROGRADE PYELOGRAM, URETEROSCOPY AND STENT PLACEMENT Right 01/06/2016   Procedure: CYSTOSCOPY WITH RETROGRADE PYELOGRAM, URETEROSCOPY AND POSSIBLE STENT PLACEMENT;  Surgeon: Barron Alvineavid Grapey, MD;  [facility] [place];  Service: Urology;  Laterality: Right;  . ESOPHAGOGASTRODUODENOSCOPY N/A 09/20/2013   Procedure: ESOPHAGOGASTRODUODENOSCOPY (EGD);  Surgeon: West BaliSandi L Fields, MD;  [facility];  Service: Endoscopy;  Laterality: N/A;  . ESOPHAGOGASTRODUODENOSCOPY     SLF: Distal esophageal stricture.The area had surrounding mild erythema c/w impacted food bolus which was previously lodged and passed. The distal esophagus was dilated  with the Savary dilators from 12.8 mm to 16 mm/Normal duodenal bulb and second  portion of the duodenum   . HOLMIUM LASER APPLICATION Right 01/06/2016   Procedure: HOLMIUM LASER APPLICATION;  Surgeon: Barron Alvineavid Grapey, MD;  [facility] [place];  Service: Urology;  Laterality: Right;  . MALONEY DILATION N/A 09/20/2013   Procedure: MALONEY DILATION;  Surgeon: West BaliSandi L Fields, MD;  [facility];  Service: Endoscopy;  Laterality: N/A;  . SAVORY DILATION N/A 09/20/2013   Procedure: SAVORY DILATION;  Surgeon: West BaliSandi L Fields, MD;  [facility];  Service: Endoscopy;  Laterality: N/A;  . TONSILLECTOMY    . VASECTOMY    . WISDOM TOOTH EXTRACTION      Family History  Problem Relation Age of Onset  . Colon cancer Maternal Grandfather     age 2550s  . Diabetes Mother   . Stroke Mother   . Hypertension Mother   . Cancer Father   . Hypertension Father   . Esophageal cancer Father     Social History Social History  Substance Use Topics  . Smoking status: Never Smoker  . Smokeless tobacco: Not on file  . Alcohol use No    No Known Allergies  Current Outpatient Prescriptions  Medication Sig Dispense Refill  . cetirizine (ZYRTEC) 10 MG tablet Take 10 mg by mouth daily as needed for allergies.     Michael Cochran. glucosamine-chondroitin 500-400 MG tablet Take 1 tablet by mouth daily.    Michael Cochran. HYDROcodone-acetaminophen (NORCO/VICODIN) 5-325 MG tablet Take 1-2 tablets by mouth every 6 (six) hours as needed. 20 tablet 0  . lisinopril (PRINIVIL,ZESTRIL)  20 MG tablet Take 10 mg by mouth daily.     Michael Cochran. loratadine-pseudoephedrine (CLARITIN-D 24-HOUR) 10-240 MG 24 hr tablet Take 1 tablet by mouth daily.    Michael Cochran. losartan (COZAAR) 25 MG tablet Take 25 mg by mouth every morning.    . Meth-Hyo-M Bl-Na Phos-Ph Sal (URIBEL) 118 MG CAPS Take 1 capsule (118 mg total) by mouth 3 (three) times daily as needed. 15 capsule 1  . Multiple Vitamin (MULTIVITAMIN WITH MINERALS) TABS tablet Take 1 tablet by mouth daily.    . naproxen sodium (ANAPROX) 220 MG tablet Take 220 mg by mouth 2  (two) times daily as needed.    Michael Cochran. omeprazole (PRILOSEC) 20 MG capsule 1 po bid 30 minutes before meals for 3 mos then once daily FOR 3 MOS. 62 capsule 11  . tamsulosin (FLOMAX) 0.4 MG CAPS capsule Take 0.4 mg by mouth daily.     No current facility-administered medications for this visit.      Physical Exam  There were no vitals taken for this visit.  Constitutional: overall normal hygiene, normal nutrition, well developed, normal grooming, normal body habitus. Assistive device:none  Musculoskeletal: gait and station Limp none, muscle tone and strength are normal, no tremors or atrophy is present.  .  Neurological: coordination overall normal.  Deep tendon reflex/nerve stretch intact.  Sensation normal.  Cranial nerves II-XII intact.   Skin:   Normal overall no scars, lesions, ulcers or rashes. No psoriasis.  Psychiatric: Alert and oriented x 3.  Recent memory intact, remote memory unclear.  Normal mood and affect. Well groomed.  Good eye contact.  Cardiovascular: overall no swelling, no varicosities, no edema bilaterally, normal temperatures of the legs and arms, no clubbing, cyanosis and good capillary refill.  Lymphatic: palpation is normal.  Little toe right is negative with only slight tenderness.  NV intact.  The patient has been educated about the nature of the problem(s) and counseled on treatment options.  The patient appeared to understand what I have discussed and is in agreement with it.  Encounter Diagnosis  Name Primary?  . Closed fracture of phalanx of right fifth toe with routine healing, subsequent encounter Yes   X-rays were done today, reported separately.  PLAN Call if any problems.  Precautions discussed.  Continue current medications.   Return to clinic 1 month   X-rays of the little toe on the right on return.  Electronically Signed Darreld McleanWayne Kamen Hanken, MD 12/20/20179:13 AM

## 2016-12-07 ENCOUNTER — Ambulatory Visit: Payer: PRIVATE HEALTH INSURANCE | Admitting: Orthopaedic Surgery

## 2016-12-08 ENCOUNTER — Ambulatory Visit: Payer: PRIVATE HEALTH INSURANCE | Admitting: Orthopaedic Surgery

## 2016-12-09 ENCOUNTER — Ambulatory Visit (INDEPENDENT_AMBULATORY_CARE_PROVIDER_SITE_OTHER): Payer: PRIVATE HEALTH INSURANCE

## 2016-12-09 ENCOUNTER — Encounter: Payer: Self-pay | Admitting: Orthopaedic Surgery

## 2016-12-09 ENCOUNTER — Ambulatory Visit (INDEPENDENT_AMBULATORY_CARE_PROVIDER_SITE_OTHER): Payer: PRIVATE HEALTH INSURANCE | Admitting: Orthopaedic Surgery

## 2016-12-09 DIAGNOSIS — S92501D Displaced unspecified fracture of right lesser toe(s), subsequent encounter for fracture with routine healing: Secondary | ICD-10-CM | POA: Diagnosis not present

## 2016-12-09 NOTE — Progress Notes (Signed)
Patient Michael Cochran, male DOB:06-Jul-1961, 56 y.o. JYN:[id number]  Chief Complaint  Patient presents with  . Follow-up    Fracture Care, closed fracture of phalanx of right fifth toe    HPI  Michael Cochran is a 56 y.o. male who has fracture of the right little toe, about ten weeks old.  He is doing well.  He has no problem. He is using regular shoes.  He has no new trauma. HPI  There is no height or weight on file to calculate BMI.  ROS  Review of Systems  HENT: Negative for congestion.   Respiratory: Negative for cough and shortness of breath.   Cardiovascular: Negative for chest pain and leg swelling.  Endocrine: Negative for cold intolerance.  Musculoskeletal: Positive for arthralgias and gait problem.  Allergic/Immunologic: Positive for environmental allergies.    Past Medical History:  Diagnosis Date  . Arthritis    gouty  . History of esophageal stricture    W/ DILATION  . HTN (hypertension)   . 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 Right ureteral stone   . Seasonal allergies     Past Surgical History:  Procedure Laterality Date  . COLONOSCOPY N/A 09/20/2013   Procedure: COLONOSCOPY;  Surgeon: West Bali, MD;  [facility];  Service: Endoscopy;  Laterality: N/A;  8:30  . CYSTOSCOPY WITH RETROGRADE PYELOGRAM, URETEROSCOPY AND STENT PLACEMENT Right 01/06/2016   Procedure: CYSTOSCOPY WITH RETROGRADE PYELOGRAM, URETEROSCOPY AND POSSIBLE STENT PLACEMENT;  Surgeon: Barron Alvine, MD;  Location: [facility];  Service: Urology;  Laterality: Right;  . ESOPHAGOGASTRODUODENOSCOPY N/A 09/20/2013   Procedure: ESOPHAGOGASTRODUODENOSCOPY (EGD);  Surgeon: West Bali, MD;  [facility];  Service: Endoscopy;  Laterality: N/A;  . ESOPHAGOGASTRODUODENOSCOPY     SLF: Distal esophageal stricture.The area had surrounding mild erythema c/w impacted food bolus which was previously lodged and passed. The distal esophagus was dilated  with the  Savary dilators from 12.8 mm to 16 mm/Normal duodenal bulb and second portion of the duodenum   . HOLMIUM LASER APPLICATION Right 01/06/2016   Procedure: HOLMIUM LASER APPLICATION;  Surgeon: Barron Alvine, MD;  Location: [facility];  Service: Urology;  Laterality: Right;  . MALONEY DILATION N/A 09/20/2013   Procedure: MALONEY DILATION;  Surgeon: West Bali, MD;  [facility];  Service: Endoscopy;  Laterality: N/A;  . SAVORY DILATION N/A 09/20/2013   Procedure: SAVORY DILATION;  Surgeon: West Bali, MD;  [facility];  Service: Endoscopy;  Laterality: N/A;  . TONSILLECTOMY    . VASECTOMY    . WISDOM TOOTH EXTRACTION      Family History  Problem Relation Age of Onset  . Diabetes Mother   . Stroke Mother   . Hypertension Mother   . Cancer Father   . Hypertension Father   . Esophageal cancer Father   . Colon cancer Maternal Grandfather     age 29s    Social History Social History  Substance Use Topics  . Smoking status: Never Smoker  . Smokeless tobacco: Not on file  . Alcohol use No    No Known Allergies  Current Outpatient Prescriptions  Medication Sig Dispense Refill  . cetirizine (ZYRTEC) 10 MG tablet Take 10 mg by mouth daily as needed for allergies.     Marland Kitchen glucosamine-chondroitin 500-400 MG tablet Take 1 tablet by mouth daily.    Marland Kitchen HYDROcodone-acetaminophen (NORCO/VICODIN) 5-325 MG tablet Take 1-2 tablets by mouth every  6 (six) hours as needed. 20 tablet 0  . lisinopril (PRINIVIL,ZESTRIL) 20 MG tablet Take 10 mg by mouth daily.     Marland Kitchen. loratadine-pseudoephedrine (CLARITIN-D 24-HOUR) 10-240 MG 24 hr tablet Take 1 tablet by mouth daily.    Marland Kitchen. losartan (COZAAR) 25 MG tablet Take 25 mg by mouth every morning.    . Meth-Hyo-M Bl-Na Phos-Ph Sal (URIBEL) 118 MG CAPS Take 1 capsule (118 mg total) by mouth 3 (three) times daily as needed. 15 capsule 1  . Multiple Vitamin (MULTIVITAMIN WITH MINERALS) TABS tablet Take 1 tablet by mouth daily.     . naproxen sodium (ANAPROX) 220 MG tablet Take 220 mg by mouth 2 (two) times daily as needed.    Marland Kitchen. omeprazole (PRILOSEC) 20 MG capsule 1 po bid 30 minutes before meals for 3 mos then once daily FOR 3 MOS. 62 capsule 11  . tamsulosin (FLOMAX) 0.4 MG CAPS capsule Take 0.4 mg by mouth daily.     No current facility-administered medications for this visit.      Physical Exam  There were no vitals taken for this visit.  Constitutional: overall normal hygiene, normal nutrition, well developed, normal grooming, normal body habitus. Assistive device:none  Musculoskeletal: gait and station Limp none, muscle tone and strength are normal, no tremors or atrophy is present.  .  Neurological: coordination overall normal.  Deep tendon reflex/nerve stretch intact.  Sensation normal.  Cranial nerves II-XII intact.   Skin:   Normal overall no scars, lesions, ulcers or rashes. No psoriasis.  Psychiatric: Alert and oriented x 3.  Recent memory intact, remote memory unclear.  Normal mood and affect. Well groomed.  Good eye contact.  Cardiovascular: overall no swelling, no varicosities, no edema bilaterally, normal temperatures of the legs and arms, no clubbing, cyanosis and good capillary refill.  Lymphatic: palpation is normal.  Right little toe with no problem, no pain, normal gait.  The patient has been educated about the nature of the problem(s) and counseled on treatment options.  The patient appeared to understand what I have discussed and is in agreement with it.  Encounter Diagnosis  Name Primary?  . Closed fracture of phalanx of right fifth toe with routine healing, subsequent encounter Yes    PLAN Call if any problems.  Precautions discussed.  Continue current medications.   Return to clinic PRN   Electronically Signed Darreld McleanWayne Mayrin Schmuck, MD 1/19/20189:06 AM

## 2017-08-29 ENCOUNTER — Ambulatory Visit (INDEPENDENT_AMBULATORY_CARE_PROVIDER_SITE_OTHER): Payer: No Typology Code available for payment source | Admitting: Internal Medicine

## 2017-08-29 ENCOUNTER — Encounter: Payer: Self-pay | Admitting: Internal Medicine

## 2017-08-29 VITALS — BP 144/91 | HR 79 | Temp 97.4°F | Ht 73.5 in | Wt 269.0 lb

## 2017-08-29 DIAGNOSIS — K219 Gastro-esophageal reflux disease without esophagitis: Secondary | ICD-10-CM | POA: Diagnosis not present

## 2017-08-29 DIAGNOSIS — K222 Esophageal obstruction: Secondary | ICD-10-CM | POA: Diagnosis not present

## 2017-08-29 NOTE — Patient Instructions (Signed)
GERD information  Anti-reflux diet information  Weight loss / regular exercise recommended  Repeat colonoscopy in 6 years  Office follow-up as needed

## 2017-08-29 NOTE — Progress Notes (Signed)
Primary Care Physician:  Carylon Perches, MD Primary Gastroenterologist:  Dr. Jena Gauss  Pre-Procedure History & Physical: HPI:  Michael Cochran is a 56 y.o. male here for follow-up of GERD with a history of peptic stricture requiring dilation 2014.  Last seen here 3 years ago. He's weaned himself off omeprazole. Concerned about side effects he reads in the newspapers. Currently does not have any typical reflux symptoms or any dysphagia. Her main significantly over his ideal body weight. Having issues with abdominal pain or change in bowel habits. Negative colonoscopy 2014 ( diverticulosis). Slated for screening 2024.  Sees Dr. Ouida Sills.  Past Medical History:  Diagnosis Date  . Arthritis    gouty  . History of esophageal stricture    W/ DILATION  . HTN (hypertension)   . 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 Right ureteral stone   . Seasonal allergies     Past Surgical History:  Procedure Laterality Date  . COLONOSCOPY N/A 09/20/2013   Procedure: COLONOSCOPY;  Surgeon: West Bali, MD;  [facility];  Service: Endoscopy;  Laterality: N/A;  8:30  . CYSTOSCOPY WITH RETROGRADE PYELOGRAM, URETEROSCOPY AND STENT PLACEMENT Right 01/06/2016   Procedure: CYSTOSCOPY WITH RETROGRADE PYELOGRAM, URETEROSCOPY AND POSSIBLE STENT PLACEMENT;  Surgeon: Barron Alvine, MD;  Location: [facility];  Service: Urology;  Laterality: Right;  . ESOPHAGOGASTRODUODENOSCOPY N/A 09/20/2013   Procedure: ESOPHAGOGASTRODUODENOSCOPY (EGD);  Surgeon: West Bali, MD;  [facility];  Service: Endoscopy;  Laterality: N/A;  . ESOPHAGOGASTRODUODENOSCOPY     SLF: Distal esophageal stricture.The area had surrounding mild erythema c/w impacted food bolus which was previously lodged and passed. The distal esophagus was dilated  with the Savary dilators from 12.8 mm to 16 mm/Normal duodenal bulb and second portion of the duodenum   . HOLMIUM LASER APPLICATION Right 01/06/2016   Procedure: HOLMIUM  LASER APPLICATION;  Surgeon: Barron Alvine, MD;  Location: [facility];  Service: Urology;  Laterality: Right;  . MALONEY DILATION N/A 09/20/2013   Procedure: MALONEY DILATION;  Surgeon: West Bali, MD;  [facility];  Service: Endoscopy;  Laterality: N/A;  . SAVORY DILATION N/A 09/20/2013   Procedure: SAVORY DILATION;  Surgeon: West Bali, MD;  [facility];  Service: Endoscopy;  Laterality: N/A;  . TONSILLECTOMY    . VASECTOMY    . WISDOM TOOTH EXTRACTION      Prior to Admission medications   Medication Sig Start Date End Date Taking? Authorizing Provider  Cholecalciferol (VITAMIN D3) 10000 units capsule Take 10,000 Units by mouth daily.   Yes [provider]  gabapentin (NEURONTIN) 300 MG capsule Take 300 mg by mouth once a week.   Yes [provider]  glucosamine-chondroitin 500-400 MG tablet Take 2 tablets by mouth daily.    Yes [provider]  loratadine (CLARITIN) 10 MG tablet Take 10 mg by mouth daily as needed for allergies.   Yes [provider]  losartan (COZAAR) 25 MG tablet Take 50 mg by mouth every morning.    Yes [provider]  Misc Natural Products (TART CHERRY ADVANCED PO) Take 2 tablets by mouth daily.   Yes [provider]  TURMERIC PO Take by mouth daily.   Yes [provider]  VITAMIN K PO Take by mouth daily.   Yes [provider]    Allergies as of 08/29/2017  . (No Known Allergies)    Family History  Problem  Relation Age of Onset  . Diabetes Mother   . Stroke Mother   . Hypertension Mother   . Cancer Father   . Hypertension Father   . Esophageal cancer Father   . Colon cancer Maternal Grandfather        age 19s    Social History   Social History  . Marital status: Married    Spouse name: N/A  . Number of children: N/A  . Years of education: N/A   Occupational History  . Not on file.   Social History Main Topics  . Smoking  status: Never Smoker  . Smokeless tobacco: Never Used  . Alcohol use No  . Drug use: No  . Sexual activity: Not on file   Other Topics Concern  . Not on file   Social History Narrative  . No narrative on file    Review of Systems: See HPI, otherwise negative ROS  Physical Exam: BP (!) 144/91   Pulse 79   Temp (!) 97.4 F (36.3 C) (Oral)   Ht 6' 1.5" (1.867 m)   Wt 269 lb (122 kg)   BMI 35.01 kg/m  General:   Alert,  Well-developed, well-nourished, pleasant and cooperative in NAD Neck:  Supple; no masses or thyromegaly. No significant cervical adenopathy. Lungs:  Clear throughout to auscultation.   No wheezes, crackles, or rhonchi. No acute distress. Heart:  Regular rate and rhythm; no murmurs, clicks, rubs,  or gallops. Abdomen: Obese. Positive bowel sounds..  Soft and nontender without appreciable mass or hepatosplenomegaly.  Pulses:  Normal pulses noted. Extremities:  Without clubbing or edema.  Impression:  Pleasant obese 56 year old gentleman with a history of GERD with peptic stricture previously dilated. No Barrett's esophagus seen at his 2014 EGD. He has stopped proton pump inhibitor therapy and is doing the best she can to manage with dietary measures alone.  Currently, does not have any regular reflux symptoms probably on the order once monthly.  We talked about the pros and cons of long-term acid suppression therapy.  In particular, we discussed the risks.  Recommendations:  GERD information  Anti-reflux diet information  Weight loss / regular exercise recommended  If recurrent reflux symptoms more than 3 times a week, he should let me know.  For rare reflux symptoms, on demand treatment  - Pepcid Complete recommended.   Daily PPI therapy ID [place] patient understands that not being on acid suppression rectally, may increase the chances of her current stricture formation.  Repeat colonoscopy in 6 years  Office follow-up as needed   Notice: This  dictation was prepared with Dragon dictation along with smaller phrase technology. Any transcriptional errors that result from this process are unintentional and may not be corrected upon review.

## 2017-11-21 HISTORY — PX: BACK SURGERY: SHX140

## 2018-03-02 ENCOUNTER — Other Ambulatory Visit: Payer: Self-pay

## 2018-03-02 ENCOUNTER — Emergency Department (HOSPITAL_COMMUNITY)
Admission: EM | Admit: 2018-03-02 | Discharge: 2018-03-02 | Disposition: A | Discharge status: Home / Self Care | Payer: No Typology Code available for payment source | Attending: Emergency Medicine | Admitting: Emergency Medicine

## 2018-03-02 ENCOUNTER — Encounter (HOSPITAL_COMMUNITY): Payer: Self-pay | Admitting: Emergency Medicine

## 2018-03-02 DIAGNOSIS — I10 Essential (primary) hypertension: Secondary | ICD-10-CM | POA: Diagnosis not present

## 2018-03-02 DIAGNOSIS — M545 Low back pain: Secondary | ICD-10-CM | POA: Diagnosis not present

## 2018-03-02 DIAGNOSIS — M5431 Sciatica, right side: Secondary | ICD-10-CM

## 2018-03-02 DIAGNOSIS — M5136 Other intervertebral disc degeneration, lumbar region: Secondary | ICD-10-CM

## 2018-03-02 MED ORDER — OXYCODONE-ACETAMINOPHEN 5-325 MG PO TABS
2.0000 | ORAL_TABLET | Freq: Once | ORAL | Status: AC
  Administered 2018-03-02: 2 via ORAL
  Filled 2018-03-02: qty 2

## 2018-03-02 MED ORDER — HYDROCODONE-ACETAMINOPHEN 5-325 MG PO TABS
1.0000 | ORAL_TABLET | Freq: Once | ORAL | Status: AC
  Administered 2018-03-02: 1 via ORAL
  Filled 2018-03-02: qty 1

## 2018-03-02 MED ORDER — OXYCODONE-ACETAMINOPHEN 5-325 MG PO TABS: 1.0000 | ORAL_TABLET | Freq: Four times a day (QID) | ORAL | 0 refills | Status: DC | PRN

## 2018-03-02 MED ORDER — PREDNISONE 20 MG PO TABS
60.0000 mg | ORAL_TABLET | Freq: Once | ORAL | Status: AC
  Administered 2018-03-02: 60 mg via ORAL
  Filled 2018-03-02: qty 3

## 2018-03-02 MED ORDER — PREDNISONE 20 MG PO TABS: ORAL_TABLET | ORAL | 0 refills | Status: DC

## 2018-03-02 NOTE — ED Provider Notes (Addendum)
Patient placed in Quick Look pathway, seen and evaluated   Chief Complaint: Back pain, leg numbness  HPI:   57 year old male presents with worsening low back pain and worsening numbness and tingling of his right lower extremity.  He is previously been seen by Dr. Lovell SheehanJenkins and had an MRI approximately 3 years ago and was told that it was non-operable.  He has not done any back injections.  He states that 2 weeks ago he was sitting in a chair overnight because his wife was having surgery and this seems to cause the exacerbation of his symptoms.  The numbness used to go down to his thigh but now it goes all the way down to his calf and he reports associated calf cramping.  He denies leg weakness but has more difficulty walking. No fever, syncope, trauma, unexplained weight loss, hx of cancer, loss of bowel/bladder function, saddle anesthesia, urinary retention, IVDU. No trauma. He has been taking Gabapentin and Aleve with minimal relief.   ROS: +back pain, +right leg numbness  -weakness, bowel/bladder incontinence  Physical Exam:   Gen: No distress  Neuro: Awake and Alert  Skin: Warm    Focused Exam: Back No masses, deformity, or rash. No tenderness. Pain is elicited with movement of the back. Strength: 5/5 in lower extremities and normal plantar and dorsiflexion. Subjective numbness over the medial aspect of the calf. 2+ DP pulse.    Norco ordered. Initiation of care has begun. The patient has been counseled on the process, plan, and necessity for staying for the completion/evaluation, and the remainder of the medical screening examination         Bethel BornGekas, Alyria Krack Marie, PA-C 03/02/18 1430    Benjiman CorePickering, Nathan, MD 03/02/18 2215

## 2018-03-02 NOTE — ED Triage Notes (Signed)
Patient complains of right leg pain and numbness that has gradually gotten worse over the last two weeks. History of herniated discs and numbness in right leg.

## 2018-03-02 NOTE — Discharge Instructions (Addendum)
It was our pleasure to provide your ER care today - we hope that you feel better.  Rest. Avoid heavy lifting > 20 lbs and bending at waist for the next week.  Try to find position of comfort (on back with pillow under knees, or on side with pillow between knees).  Take prednisone as prescribed.   You may take percocet as need for pain. No driving for the next 6 hours or when taking percocet. Also, do not take tylenol or acetaminophen containing medication when taking percocet.   Follow up with Dr Lovell SheehanJenkins in the next week - call office Monday to arrange appointment.  Return to ER if worse, new symptoms, fevers, intractable pain, progressive weakness, other concern.

## 2018-03-02 NOTE — ED Provider Notes (Signed)
[facility] EMERGENCY DEPARTMENT Provider Note   CSN: [id number] Arrival date & time: 03/02/18  1323     History   Chief Complaint Chief Complaint  Patient presents with  . Numbness  . Back Pain    HPI Ceasar LundStephen B Studzinski is a 57 y.o. male.  Patient c/o right low back pain radiating to right leg for past couple days. Pain mod-severe, persistent, worse w certain movements/positions/changes in position, radiates to right leg. Occasional paresthesias to right leg. No weakness. Pt states prior to increased pain he had slept in chair and also done leg workout at gym - but denies specific injury. Hx ddd with similar pain down right leg in past. Previously had mri and was told surgery may not help. He indicates normally has some pain to same area but worse now. No problems controlling bowel/bladder habits. No fevers.   The history is provided by the patient.  Back Pain   Associated symptoms include numbness. Pertinent negatives include no chest pain, no fever, no abdominal pain and no weakness.    Past Medical History:  Diagnosis Date  . Arthritis    gouty  . History of esophageal stricture    W/ DILATION  . HTN (hypertension)   . 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 Right ureteral stone   . Seasonal allergies     Patient Active Problem List   Diagnosis Date Noted  . GERD (gastroesophageal reflux disease) 02/19/2014  . FH: colon cancer 08/19/2013  . Right lower quadrant abdominal tenderness 08/19/2013  . DYSPHAGIA UNSPECIFIED 02/18/2009    Past Surgical History:  Procedure Laterality Date  . COLONOSCOPY N/A 09/20/2013   Procedure: COLONOSCOPY;  Surgeon: West BaliSandi L Fields, MD;  [facility];  Service: Endoscopy;  Laterality: N/A;  8:30  . CYSTOSCOPY WITH RETROGRADE PYELOGRAM, URETEROSCOPY AND STENT PLACEMENT Right 01/06/2016   Procedure: CYSTOSCOPY WITH RETROGRADE PYELOGRAM, URETEROSCOPY AND POSSIBLE STENT PLACEMENT;  Surgeon: Barron Alvineavid Grapey, MD;  [facility];  Service: Urology;  Laterality: Right;  . ESOPHAGOGASTRODUODENOSCOPY N/A 09/20/2013   Procedure: ESOPHAGOGASTRODUODENOSCOPY (EGD);  Surgeon: West BaliSandi L Fields, MD;  [facility];  Service: Endoscopy;  Laterality: N/A;  . ESOPHAGOGASTRODUODENOSCOPY     SLF: Distal esophageal stricture.The area had surrounding mild erythema c/w impacted food bolus which was previously lodged and passed. The distal esophagus was dilated  with the Savary dilators from 12.8 mm to 16 mm/Normal duodenal bulb and second portion of the duodenum   . HOLMIUM LASER APPLICATION Right 01/06/2016   Procedure: HOLMIUM LASER APPLICATION;  Surgeon: Barron Alvineavid Grapey, MD;  [facility];  Service: Urology;  Laterality: Right;  . MALONEY DILATION N/A 09/20/2013   Procedure: MALONEY DILATION;  Surgeon: West BaliSandi L Fields, MD;  [facility];  Service: Endoscopy;  Laterality: N/A;  . SAVORY DILATION N/A 09/20/2013   Procedure: SAVORY DILATION;  Surgeon: West BaliSandi L Fields, MD;  [facility];  Service: Endoscopy;  Laterality: N/A;  . TONSILLECTOMY    . VASECTOMY    . WISDOM TOOTH EXTRACTION          Home Medications    Prior to Admission medications   Medication Sig Start Date End Date Taking? Authorizing Provider  Cholecalciferol (VITAMIN D3) 10000 units capsule Take 10,000 Units by mouth daily.    [provider]  gabapentin (NEURONTIN) 300 MG capsule Take 300 mg by mouth once a week.    [provider]  glucosamine-chondroitin 500-400 MG tablet Take 2 tablets by mouth daily.     [provider]  loratadine (CLARITIN) 10 MG tablet Take 10 mg by mouth daily as needed for allergies.    [provider]  losartan (COZAAR) 25 MG tablet Take 50 mg by mouth every morning.     [provider]  Misc Natural Products (TART CHERRY ADVANCED PO) Take 2 tablets by mouth daily.    [provider]  TURMERIC PO Take by  mouth daily.    [provider]  VITAMIN K PO Take by mouth daily.    [provider]    Family History Family History  Problem Relation Age of Onset  . Diabetes Mother   . Stroke Mother   . Hypertension Mother   . Cancer Father   . Hypertension Father   . Esophageal cancer Father   . Colon cancer Maternal Grandfather        age 56s    Social History Social History   Tobacco Use  . Smoking status: Never Smoker  . Smokeless tobacco: Never Used  Substance Use Topics  . Alcohol use: No  . Drug use: No     Allergies   Lisinopril   Review of Systems Review of Systems  Constitutional: Negative for fever.  Cardiovascular: Negative for chest pain.  Gastrointestinal: Negative for abdominal pain.  Musculoskeletal: Positive for back pain.  Skin: Negative for rash.  Neurological: Positive for numbness. Negative for weakness.     Physical Exam Updated Vital Signs BP (!) 137/116 (BP Location: Right Arm)   Pulse (!) 48   Temp 97.6 F (36.4 C) (Oral)   Resp 16   SpO2 98%   Physical Exam  Constitutional: He appears well-developed and well-nourished. No distress.  HENT:  Head: Atraumatic.  Eyes: Conjunctivae are normal.  Neck: No tracheal deviation present.  Cardiovascular: Intact distal pulses.  Pulmonary/Chest: Effort normal. No accessory muscle usage. No respiratory distress.  Abdominal: He exhibits no distension.  Genitourinary:  Genitourinary Comments: No cva tenderness  Musculoskeletal: He exhibits no edema.  LS spine non tender, aligned. No focal bony tenderness.   Neurological: He is alert.  Right straight leg raise neg. Motor rle 5/5. sens grossly intact. Pt notes paresthesias. Steady gait.   Skin: Skin is warm and dry. No rash noted. He is not diaphoretic.  Psychiatric: He has a normal mood and affect.  Nursing note and vitals reviewed.    ED Treatments / Results  Labs (all labs ordered are listed, but only abnormal results are  displayed) Labs Reviewed - No data to display  EKG None  Radiology No results found.  Procedures Procedures (including critical care time)  Medications Ordered in ED Medications  predniSONE (DELTASONE) tablet 60 mg (has no administration in time range)  oxyCODONE-acetaminophen (PERCOCET/ROXICET) 5-325 MG per tablet 2 tablet (has no administration in time range)  HYDROcodone-acetaminophen (NORCO/VICODIN) 5-325 MG per tablet 1 tablet (1 tablet Oral Given 03/02/18 1427)     Initial Impression / Assessment and Plan / ED Course  I have reviewed the triage vital signs and the nursing notes.  Pertinent labs & imaging results that were available during my care of the patient were reviewed by me and considered in my medical decision making (see chart for details).  Confirmed nkda x lis.   Reviewed nursing notes and prior charts for additional history.  Reviewed prior mri - deg disc disease then.  Pt does not have to drive home.  Prednisone po. Oxycodone po.   Discussed f/u with his NS, Dr Lovell Sheehan this week if symptoms fail to improve/resolve - possible outpt imaging then.  Patient currently appear stable for d/c.        Final Clinical Impressions(s) / ED Diagnoses   Final diagnoses:  None    ED Discharge Orders    None       Cathren Laine, MD 03/02/18 [phone]

## 2018-03-02 NOTE — ED Notes (Signed)
Gave patient ice pack. Explained delay. Patient understanding

## 2018-03-23 ENCOUNTER — Other Ambulatory Visit: Payer: Self-pay | Admitting: Neurosurgery

## 2018-03-23 DIAGNOSIS — M5441 Lumbago with sciatica, right side: Secondary | ICD-10-CM

## 2018-03-24 ENCOUNTER — Ambulatory Visit
Admission: RE | Admit: 2018-03-24 | Discharge: 2018-03-24 | Disposition: A | Discharge status: Home / Self Care | Payer: No Typology Code available for payment source | Source: Ambulatory Visit | Attending: Neurosurgery | Admitting: Neurosurgery

## 2018-03-24 DIAGNOSIS — M5441 Lumbago with sciatica, right side: Secondary | ICD-10-CM

## 2018-09-20 ENCOUNTER — Other Ambulatory Visit: Payer: Self-pay | Admitting: Neurosurgery

## 2018-09-20 DIAGNOSIS — M5416 Radiculopathy, lumbar region: Secondary | ICD-10-CM

## 2018-09-30 ENCOUNTER — Ambulatory Visit
Admission: RE | Admit: 2018-09-30 | Discharge: 2018-09-30 | Disposition: A | Discharge status: Home / Self Care | Payer: No Typology Code available for payment source | Source: Ambulatory Visit | Attending: Neurosurgery | Admitting: Neurosurgery

## 2018-09-30 DIAGNOSIS — M5416 Radiculopathy, lumbar region: Secondary | ICD-10-CM

## 2018-09-30 MED ORDER — GADOBENATE DIMEGLUMINE 529 MG/ML IV SOLN
20.0000 mL | Freq: Once | INTRAVENOUS | Status: AC | PRN
  Administered 2018-09-30: 20 mL via INTRAVENOUS

## 2018-10-09 ENCOUNTER — Other Ambulatory Visit: Payer: Self-pay | Admitting: Neurosurgery

## 2018-10-26 ENCOUNTER — Encounter (HOSPITAL_COMMUNITY)
Admission: RE | Admit: 2018-10-26 | Discharge: 2018-10-26 | Disposition: A | Discharge status: Home / Self Care | Payer: No Typology Code available for payment source | Source: Ambulatory Visit | Attending: Neurosurgery | Admitting: Neurosurgery

## 2018-10-26 ENCOUNTER — Other Ambulatory Visit: Payer: Self-pay

## 2018-10-26 ENCOUNTER — Encounter (HOSPITAL_COMMUNITY): Payer: Self-pay

## 2018-10-26 DIAGNOSIS — Z01812 Encounter for preprocedural laboratory examination: Secondary | ICD-10-CM | POA: Insufficient documentation

## 2018-10-26 HISTORY — DX: Gastro-esophageal reflux disease without esophagitis: K21.9

## 2018-10-26 HISTORY — DX: Pneumonia, unspecified organism: J18.9

## 2018-10-26 LAB — CBC
HEMATOCRIT: 47 % (ref 39.0–52.0)
Hemoglobin: 15.8 g/dL (ref 13.0–17.0)
MCH: 28.5 pg (ref 26.0–34.0)
MCHC: 33.6 g/dL (ref 30.0–36.0)
MCV: 84.8 fL (ref 80.0–100.0)
Platelets: 262 10*3/uL (ref 150–400)
RBC: 5.54 MIL/uL (ref 4.22–5.81)
RDW: 12.6 % (ref 11.5–15.5)
WBC: 7.4 10*3/uL (ref 4.0–10.5)
nRBC: 0 % (ref 0.0–0.2)

## 2018-10-26 LAB — BASIC METABOLIC PANEL
Anion gap: 11 (ref 5–15)
BUN: 10 mg/dL (ref 6–20)
CALCIUM: 9.6 mg/dL (ref 8.9–10.3)
CO2: 24 mmol/L (ref 22–32)
CREATININE: 0.99 mg/dL (ref 0.61–1.24)
Chloride: 103 mmol/L (ref 98–111)
GFR calc Af Amer: >60 mL/min (ref >60)
GFR calc non Af Amer: >60 mL/min (ref >60)
GLUCOSE: 100 mg/dL — AB (ref 70–99)
Potassium: 4.2 mmol/L (ref 3.5–5.1)
Sodium: 138 mmol/L (ref 135–145)

## 2018-10-26 LAB — TYPE AND SCREEN
ABO/RH(D): O NEG
ANTIBODY SCREEN: NEGATIVE

## 2018-10-26 LAB — SURGICAL PCR SCREEN
MRSA, PCR: NEGATIVE
Staphylococcus aureus: NEGATIVE

## 2018-10-26 LAB — ABO/RH: ABO/RH(D): O NEG

## 2018-10-26 NOTE — Progress Notes (Signed)
PCP -  Carylon Perchesoy Fagan MD  Chest x-ray - N/A EKG - requested   Blood Thinner Instructions: N/A Aspirin Instructions:N/A  Anesthesia review: requested EKG from PCP  Patient denies shortness of breath, fever, cough and chest pain at PAT appointment   Patient verbalized understanding of instructions that were given to them at the PAT appointment. Patient was also instructed that they will need to review over the PAT instructions again at home before surgery.

## 2018-10-26 NOTE — Pre-Procedure Instructions (Signed)
Michael LundStephen B Cochran  10/26/2018      [facility] - [place], [place] - [address] [place] [address] Phone: [phone] Fax: [phone]    Your procedure is scheduled on Monday December 16th.  [facility].M[facility]  Call this number if you have problems the morning of surgery:  [phone]   Remember:  Do not eat or drink after midnight.    Take these medicines the morning of surgery with A SIP OF WATER  anastrozole (ARIMIDEX)   7 days prior to surgery STOP taking any Aspirin(unless otherwise instructed by your surgeon), Aleve, Naproxen, Ibuprofen, Motrin, Advil, Goody's, BC's, all herbal medications, fish oil, and all vitamins     Do not wear jewelry.  Do not wear lotions, powders, or colognes, or deodorant.  Men may shave face and neck.  Do not bring valuables to the hospital.  [facility] is not responsible for any belongings or valuables.  Contacts, dentures or bridgework may not be worn into surgery.  Leave your suitcase in the car.  After surgery it may be brought to your room.  For patients admitted to the hospital, discharge time will be determined by your treatment team.  Patients discharged the day of surgery will not be allowed to drive home.    Michael Cochran- Preparing For Surgery  Before surgery, you can play an important role. Because skin is not sterile, your skin needs to be as free of germs as possible. You can reduce the number of germs on your skin by washing with CHG (chlorahexidine gluconate) Soap before surgery.  CHG is an antiseptic cleaner which kills germs and bonds with the skin to continue killing germs even after washing.    Oral Hygiene is also important to reduce your risk of infection.  Remember - BRUSH YOUR TEETH THE MORNING OF SURGERY WITH YOUR REGULAR TOOTHPASTE  Please do not use if you have an allergy to CHG or antibacterial soaps. If your skin becomes reddened/irritated stop  using the CHG.  Do not shave (including legs and underarms) for at least 48 hours prior to first CHG shower. It is OK to shave your face.  Please follow these instructions carefully.   1. Shower the NIGHT BEFORE SURGERY and the MORNING OF SURGERY with CHG.   2. If you chose to wash your hair, wash your hair first as usual with your normal shampoo.  3. After you shampoo, rinse your hair and body thoroughly to remove the shampoo.  4. Use CHG as you would any other liquid soap. You can apply CHG directly to the skin and wash gently with a scrungie or a clean washcloth.   5. Apply the CHG Soap to your body ONLY FROM THE NECK DOWN.  Do not use on open wounds or open sores. Avoid contact with your eyes, ears, mouth and genitals (private parts). Wash Face and genitals (private parts)  with your normal soap.  6. Wash thoroughly, paying special attention to the area where your surgery will be performed.  7. Thoroughly rinse your body with warm water from the neck down.  8. DO NOT shower/wash with your normal soap after using and rinsing off the CHG Soap.  9. Pat yourself dry with a CLEAN TOWEL.  10. Wear CLEAN PAJAMAS to bed the night before surgery, wear comfortable clothes the morning of surgery  11. Place CLEAN SHEETS on your bed the night of your first  shower and DO NOT SLEEP WITH PETS.    Day of Surgery:  Do not apply any deodorants/lotions.  Please wear clean clothes to the hospital/surgery center.   Remember to brush your teeth WITH YOUR REGULAR TOOTHPASTE.    Please read over the following fact sheets that you were given.

## 2018-10-30 ENCOUNTER — Other Ambulatory Visit: Payer: Self-pay | Admitting: Neurosurgery

## 2018-11-02 MED ORDER — CEFAZOLIN SODIUM 10 G IJ SOLR
3.0000 g | INTRAMUSCULAR | Status: AC
  Administered 2018-11-05: 3 g via INTRAVENOUS
  Filled 2018-11-02: qty 3000

## 2018-11-05 ENCOUNTER — Inpatient Hospital Stay (HOSPITAL_COMMUNITY): Payer: No Typology Code available for payment source

## 2018-11-05 ENCOUNTER — Encounter (HOSPITAL_COMMUNITY): Payer: Self-pay

## 2018-11-05 ENCOUNTER — Inpatient Hospital Stay (HOSPITAL_COMMUNITY): Payer: No Typology Code available for payment source | Admitting: Physician Assistant

## 2018-11-05 ENCOUNTER — Inpatient Hospital Stay (HOSPITAL_COMMUNITY)
Admission: RE | Admit: 2018-11-05 | Discharge: 2018-11-06 | DRG: 455 | Disposition: A | Discharge status: Home / Self Care | Payer: No Typology Code available for payment source | Attending: Neurosurgery | Admitting: Neurosurgery

## 2018-11-05 ENCOUNTER — Inpatient Hospital Stay (HOSPITAL_COMMUNITY): Payer: No Typology Code available for payment source | Admitting: Anesthesiology

## 2018-11-05 ENCOUNTER — Encounter (HOSPITAL_COMMUNITY)
Admission: RE | Disposition: A | Discharge status: Home / Self Care | Payer: Self-pay | Source: Home / Self Care | Attending: Neurosurgery

## 2018-11-05 ENCOUNTER — Other Ambulatory Visit: Payer: Self-pay

## 2018-11-05 DIAGNOSIS — Z79899 Other long term (current) drug therapy: Secondary | ICD-10-CM | POA: Diagnosis not present

## 2018-11-05 DIAGNOSIS — M5116 Intervertebral disc disorders with radiculopathy, lumbar region: Principal | ICD-10-CM | POA: Diagnosis present

## 2018-11-05 DIAGNOSIS — K219 Gastro-esophageal reflux disease without esophagitis: Secondary | ICD-10-CM | POA: Diagnosis present

## 2018-11-05 DIAGNOSIS — J302 Other seasonal allergic rhinitis: Secondary | ICD-10-CM | POA: Diagnosis present

## 2018-11-05 DIAGNOSIS — Z888 Allergy status to other drugs, medicaments and biological substances status: Secondary | ICD-10-CM

## 2018-11-05 DIAGNOSIS — Z8249 Family history of ischemic heart disease and other diseases of the circulatory system: Secondary | ICD-10-CM

## 2018-11-05 DIAGNOSIS — M109 Gout, unspecified: Secondary | ICD-10-CM | POA: Diagnosis present

## 2018-11-05 DIAGNOSIS — M48061 Spinal stenosis, lumbar region without neurogenic claudication: Secondary | ICD-10-CM | POA: Diagnosis present

## 2018-11-05 DIAGNOSIS — I10 Essential (primary) hypertension: Secondary | ICD-10-CM | POA: Diagnosis present

## 2018-11-05 DIAGNOSIS — Z419 Encounter for procedure for purposes other than remedying health state, unspecified: Secondary | ICD-10-CM

## 2018-11-05 DIAGNOSIS — M5126 Other intervertebral disc displacement, lumbar region: Secondary | ICD-10-CM | POA: Diagnosis present

## 2018-11-05 SURGERY — POSTERIOR LUMBAR FUSION 1 LEVEL
Anesthesia: General | Site: Back

## 2018-11-05 MED ORDER — SODIUM CHLORIDE 0.9 % IV SOLN
INTRAVENOUS | Status: DC | PRN
  Administered 2018-11-05: 16:00:00

## 2018-11-05 MED ORDER — SODIUM CHLORIDE 0.9 % IV SOLN: 250.0000 mL | INTRAVENOUS | Status: DC

## 2018-11-05 MED ORDER — DOCUSATE SODIUM 100 MG PO CAPS
100.0000 mg | ORAL_CAPSULE | Freq: Two times a day (BID) | ORAL | Status: DC
  Administered 2018-11-05 – 2018-11-06 (×2): 100 mg via ORAL
  Filled 2018-11-05 (×2): qty 1

## 2018-11-05 MED ORDER — IRBESARTAN 150 MG PO TABS
150.0000 mg | ORAL_TABLET | Freq: Every day | ORAL | Status: DC
  Administered 2018-11-06: 150 mg via ORAL
  Filled 2018-11-05 (×3): qty 1

## 2018-11-05 MED ORDER — MORPHINE SULFATE (PF) 4 MG/ML IV SOLN: 4.0000 mg | INTRAVENOUS | Status: DC | PRN

## 2018-11-05 MED ORDER — HYDROMORPHONE HCL 1 MG/ML IJ SOLN
INTRAMUSCULAR | Status: AC
  Filled 2018-11-05: qty 1

## 2018-11-05 MED ORDER — VANCOMYCIN HCL 1 G IV SOLR
INTRAVENOUS | Status: DC | PRN
  Administered 2018-11-05: 1000 mg via TOPICAL

## 2018-11-05 MED ORDER — PHENOL 1.4 % MT LIQD: 1.0000 | OROMUCOSAL | Status: DC | PRN

## 2018-11-05 MED ORDER — ONDANSETRON HCL 4 MG PO TABS: 4.0000 mg | ORAL_TABLET | Freq: Four times a day (QID) | ORAL | Status: DC | PRN

## 2018-11-05 MED ORDER — ONDANSETRON HCL 4 MG/2ML IJ SOLN
INTRAMUSCULAR | Status: DC | PRN
  Administered 2018-11-05: 4 mg via INTRAVENOUS

## 2018-11-05 MED ORDER — ACETAMINOPHEN 325 MG PO TABS: 650.0000 mg | ORAL_TABLET | ORAL | Status: DC | PRN

## 2018-11-05 MED ORDER — OXYCODONE HCL 5 MG PO TABS: 5.0000 mg | ORAL_TABLET | ORAL | Status: DC | PRN

## 2018-11-05 MED ORDER — BACITRACIN ZINC 500 UNIT/GM EX OINT
TOPICAL_OINTMENT | CUTANEOUS | Status: AC
  Filled 2018-11-05: qty 28.35

## 2018-11-05 MED ORDER — GLYCOPYRROLATE PF 0.2 MG/ML IJ SOSY
PREFILLED_SYRINGE | INTRAMUSCULAR | Status: AC
  Filled 2018-11-05: qty 1

## 2018-11-05 MED ORDER — SODIUM CHLORIDE 0.9% FLUSH: 3.0000 mL | INTRAVENOUS | Status: DC | PRN

## 2018-11-05 MED ORDER — THROMBIN 20000 UNITS EX SOLR
CUTANEOUS | Status: AC
  Filled 2018-11-05: qty 20000

## 2018-11-05 MED ORDER — OXYCODONE HCL 5 MG PO TABS
10.0000 mg | ORAL_TABLET | ORAL | Status: DC | PRN
  Administered 2018-11-05 – 2018-11-06 (×2): 10 mg via ORAL
  Filled 2018-11-05 (×2): qty 2

## 2018-11-05 MED ORDER — PHENYLEPHRINE HCL 10 MG/ML IJ SOLN
INTRAMUSCULAR | Status: DC | PRN
  Administered 2018-11-05: 100 ug via INTRAVENOUS

## 2018-11-05 MED ORDER — THROMBIN 5000 UNITS EX SOLR
OROMUCOSAL | Status: DC | PRN
  Administered 2018-11-05: 16:00:00 via TOPICAL

## 2018-11-05 MED ORDER — ONDANSETRON HCL 4 MG/2ML IJ SOLN: 4.0000 mg | Freq: Four times a day (QID) | INTRAMUSCULAR | Status: DC | PRN

## 2018-11-05 MED ORDER — ONDANSETRON HCL 4 MG/2ML IJ SOLN
INTRAMUSCULAR | Status: AC
  Filled 2018-11-05: qty 2

## 2018-11-05 MED ORDER — MIDAZOLAM HCL 5 MG/5ML IJ SOLN
INTRAMUSCULAR | Status: DC | PRN
  Administered 2018-11-05: 2 mg via INTRAVENOUS

## 2018-11-05 MED ORDER — CEFAZOLIN SODIUM-DEXTROSE 2-4 GM/100ML-% IV SOLN
2.0000 g | Freq: Three times a day (TID) | INTRAVENOUS | Status: AC
  Administered 2018-11-05 – 2018-11-06 (×2): 2 g via INTRAVENOUS
  Filled 2018-11-05 (×2): qty 100

## 2018-11-05 MED ORDER — THROMBIN 5000 UNITS EX SOLR
CUTANEOUS | Status: AC
  Filled 2018-11-05: qty 5000

## 2018-11-05 MED ORDER — MIDAZOLAM HCL 2 MG/2ML IJ SOLN
INTRAMUSCULAR | Status: AC
  Filled 2018-11-05: qty 2

## 2018-11-05 MED ORDER — ROCURONIUM BROMIDE 50 MG/5ML IV SOSY
PREFILLED_SYRINGE | INTRAVENOUS | Status: DC | PRN
  Administered 2018-11-05: 50 mg via INTRAVENOUS
  Administered 2018-11-05 (×2): 10 mg via INTRAVENOUS

## 2018-11-05 MED ORDER — SUFENTANIL CITRATE 50 MCG/ML IV SOLN
INTRAVENOUS | Status: AC
  Filled 2018-11-05: qty 1

## 2018-11-05 MED ORDER — LIDOCAINE HCL (CARDIAC) PF 100 MG/5ML IV SOSY
PREFILLED_SYRINGE | INTRAVENOUS | Status: DC | PRN
  Administered 2018-11-05: 30 mg via INTRAVENOUS

## 2018-11-05 MED ORDER — CYCLOBENZAPRINE HCL 10 MG PO TABS
10.0000 mg | ORAL_TABLET | Freq: Three times a day (TID) | ORAL | Status: DC | PRN
  Administered 2018-11-06: 10 mg via ORAL
  Filled 2018-11-05: qty 1

## 2018-11-05 MED ORDER — ACETAMINOPHEN 500 MG PO TABS
1000.0000 mg | ORAL_TABLET | Freq: Four times a day (QID) | ORAL | Status: AC
  Administered 2018-11-05 – 2018-11-06 (×4): 1000 mg via ORAL
  Filled 2018-11-05 (×4): qty 2

## 2018-11-05 MED ORDER — DEXAMETHASONE SODIUM PHOSPHATE 10 MG/ML IJ SOLN
INTRAMUSCULAR | Status: AC
  Filled 2018-11-05: qty 1

## 2018-11-05 MED ORDER — BUPIVACAINE LIPOSOME 1.3 % IJ SUSP
20.0000 mL | INTRAMUSCULAR | Status: AC
  Administered 2018-11-05: 20 mL
  Filled 2018-11-05: qty 20

## 2018-11-05 MED ORDER — 0.9 % SODIUM CHLORIDE (POUR BTL) OPTIME
TOPICAL | Status: DC | PRN
  Administered 2018-11-05: 1000 mL

## 2018-11-05 MED ORDER — LACTATED RINGERS IV SOLN
Freq: Once | INTRAVENOUS | Status: AC
  Administered 2018-11-05: 12:00:00 via INTRAVENOUS

## 2018-11-05 MED ORDER — HYDROMORPHONE HCL 1 MG/ML IJ SOLN
0.2500 mg | INTRAMUSCULAR | Status: DC | PRN
  Administered 2018-11-05 (×2): 0.5 mg via INTRAVENOUS

## 2018-11-05 MED ORDER — THROMBIN 20000 UNITS EX SOLR
CUTANEOUS | Status: DC | PRN
  Administered 2018-11-05: 16:00:00 via TOPICAL

## 2018-11-05 MED ORDER — BUPIVACAINE-EPINEPHRINE (PF) 0.5% -1:200000 IJ SOLN
INTRAMUSCULAR | Status: DC | PRN
  Administered 2018-11-05: 10 mL

## 2018-11-05 MED ORDER — PROPOFOL 10 MG/ML IV BOLUS
INTRAVENOUS | Status: DC | PRN
  Administered 2018-11-05: 200 mg via INTRAVENOUS

## 2018-11-05 MED ORDER — LIDOCAINE 2% (20 MG/ML) 5 ML SYRINGE
INTRAMUSCULAR | Status: AC
  Filled 2018-11-05: qty 5

## 2018-11-05 MED ORDER — VANCOMYCIN HCL 1000 MG IV SOLR
INTRAVENOUS | Status: AC
  Filled 2018-11-05: qty 1000

## 2018-11-05 MED ORDER — BISACODYL 10 MG RE SUPP: 10.0000 mg | Freq: Every day | RECTAL | Status: DC | PRN

## 2018-11-05 MED ORDER — CHLORHEXIDINE GLUCONATE CLOTH 2 % EX PADS: 6.0000 | MEDICATED_PAD | Freq: Once | CUTANEOUS | Status: DC

## 2018-11-05 MED ORDER — DEXAMETHASONE SODIUM PHOSPHATE 10 MG/ML IJ SOLN
INTRAMUSCULAR | Status: DC | PRN
  Administered 2018-11-05: 10 mg via INTRAVENOUS

## 2018-11-05 MED ORDER — PROPOFOL 10 MG/ML IV BOLUS
INTRAVENOUS | Status: AC
  Filled 2018-11-05: qty 40

## 2018-11-05 MED ORDER — VITAMIN D3 25 MCG (1000 UNIT) PO TABS: 10000.0000 [IU] | ORAL_TABLET | Freq: Every day | ORAL | Status: DC

## 2018-11-05 MED ORDER — SODIUM CHLORIDE 0.9% FLUSH
3.0000 mL | Freq: Two times a day (BID) | INTRAVENOUS | Status: DC
  Administered 2018-11-05: 3 mL via INTRAVENOUS

## 2018-11-05 MED ORDER — MENTHOL 3 MG MT LOZG: 1.0000 | LOZENGE | OROMUCOSAL | Status: DC | PRN

## 2018-11-05 MED ORDER — SUGAMMADEX SODIUM 200 MG/2ML IV SOLN
INTRAVENOUS | Status: DC | PRN
  Administered 2018-11-05: 300 mg via INTRAVENOUS

## 2018-11-05 MED ORDER — ROCURONIUM BROMIDE 50 MG/5ML IV SOSY
PREFILLED_SYRINGE | INTRAVENOUS | Status: AC
  Filled 2018-11-05: qty 10

## 2018-11-05 MED ORDER — SUFENTANIL CITRATE 50 MCG/ML IV SOLN
INTRAVENOUS | Status: DC | PRN
  Administered 2018-11-05: 5 ug via INTRAVENOUS
  Administered 2018-11-05: 20 ug via INTRAVENOUS
  Administered 2018-11-05: 5 ug via INTRAVENOUS

## 2018-11-05 MED ORDER — ANASTROZOLE 1 MG PO TABS
0.5000 mg | ORAL_TABLET | Freq: Every day | ORAL | Status: DC
  Administered 2018-11-06: 0.5 mg via ORAL
  Filled 2018-11-05: qty 1

## 2018-11-05 MED ORDER — SODIUM CHLORIDE 0.9 % IV SOLN
INTRAVENOUS | Status: DC | PRN
  Administered 2018-11-05: 50 ug/min via INTRAVENOUS

## 2018-11-05 MED ORDER — ACETAMINOPHEN 650 MG RE SUPP: 650.0000 mg | RECTAL | Status: DC | PRN

## 2018-11-05 MED ORDER — PHENYLEPHRINE 40 MCG/ML (10ML) SYRINGE FOR IV PUSH (FOR BLOOD PRESSURE SUPPORT)
PREFILLED_SYRINGE | INTRAVENOUS | Status: AC
  Filled 2018-11-05: qty 10

## 2018-11-05 MED ORDER — BACITRACIN ZINC 500 UNIT/GM EX OINT
TOPICAL_OINTMENT | CUTANEOUS | Status: DC | PRN
  Administered 2018-11-05: 1 via TOPICAL

## 2018-11-05 MED ORDER — LACTATED RINGERS IV SOLN
INTRAVENOUS | Status: DC | PRN
  Administered 2018-11-05 (×3): via INTRAVENOUS

## 2018-11-05 SURGICAL SUPPLY — 68 items
APL SKNCLS STERI-STRIP NONHPOA (GAUZE/BANDAGES/DRESSINGS) ×1
BAG DECANTER FOR FLEXI CONT (MISCELLANEOUS) ×3 IMPLANT
BENZOIN TINCTURE PRP APPL 2/3 (GAUZE/BANDAGES/DRESSINGS) ×3 IMPLANT
BLADE CLIPPER SURG (BLADE) ×3 IMPLANT
BUR MATCHSTICK NEURO 3.0 LAGG (BURR) ×3 IMPLANT
BUR PRECISION FLUTE 6.0 (BURR) ×3 IMPLANT
CANISTER SUCT 3000ML PPV (MISCELLANEOUS) ×3 IMPLANT
CAP REVERE LOCKING (Cap) ×12 IMPLANT
CARTRIDGE OIL MAESTRO DRILL (MISCELLANEOUS) ×1 IMPLANT
CLOSURE WOUND 1/2 X4 (GAUZE/BANDAGES/DRESSINGS) ×1
CONT SPEC 4OZ CLIKSEAL STRL BL (MISCELLANEOUS) ×3 IMPLANT
COVER BACK TABLE 60X90IN (DRAPES) ×3 IMPLANT
COVER WAND RF STERILE (DRAPES) IMPLANT
DECANTER SPIKE VIAL GLASS SM (MISCELLANEOUS) ×3 IMPLANT
DIFFUSER DRILL AIR PNEUMATIC (MISCELLANEOUS) ×3 IMPLANT
DRAPE C-ARM 42X72 X-RAY (DRAPES) ×6 IMPLANT
DRAPE HALF SHEET 40X57 (DRAPES) ×3 IMPLANT
DRAPE LAPAROTOMY 100X72X124 (DRAPES) ×3 IMPLANT
DRAPE SURG 17X23 STRL (DRAPES) ×12 IMPLANT
DRSG OPSITE POSTOP 4X6 (GAUZE/BANDAGES/DRESSINGS) ×3 IMPLANT
ELECT BLADE 4.0 EZ CLEAN MEGAD (MISCELLANEOUS) ×3
ELECT REM PT RETURN 9FT ADLT (ELECTROSURGICAL) ×3
ELECTRODE BLDE 4.0 EZ CLN MEGD (MISCELLANEOUS) ×1 IMPLANT
ELECTRODE REM PT RTRN 9FT ADLT (ELECTROSURGICAL) ×1 IMPLANT
EVACUATOR 1/8 PVC DRAIN (DRAIN) IMPLANT
GAUZE 4X4 16PLY RFD (DISPOSABLE) IMPLANT
GAUZE SPONGE 4X4 12PLY STRL (GAUZE/BANDAGES/DRESSINGS) IMPLANT
GLOVE BIO SURGEON STRL SZ 6.5 (GLOVE) ×6 IMPLANT
GLOVE BIO SURGEON STRL SZ8 (GLOVE) ×6 IMPLANT
GLOVE BIO SURGEON STRL SZ8.5 (GLOVE) ×6 IMPLANT
GLOVE BIO SURGEONS STRL SZ 6.5 (GLOVE) ×3
GLOVE BIOGEL PI IND STRL 6.5 (GLOVE) ×3 IMPLANT
GLOVE BIOGEL PI INDICATOR 6.5 (GLOVE) ×6
GLOVE EXAM NITRILE XL STR (GLOVE) IMPLANT
GLOVE SURG SS PI 7.0 STRL IVOR (GLOVE) ×6 IMPLANT
GLOVE SURG SS PI 7.5 STRL IVOR (GLOVE) ×3 IMPLANT
GOWN STRL REUS W/ TWL LRG LVL3 (GOWN DISPOSABLE) ×3 IMPLANT
GOWN STRL REUS W/ TWL XL LVL3 (GOWN DISPOSABLE) ×2 IMPLANT
GOWN STRL REUS W/TWL 2XL LVL3 (GOWN DISPOSABLE) IMPLANT
GOWN STRL REUS W/TWL LRG LVL3 (GOWN DISPOSABLE) ×9
GOWN STRL REUS W/TWL XL LVL3 (GOWN DISPOSABLE) ×4
HEMOSTAT POWDER KIT SURGIFOAM (HEMOSTASIS) ×3 IMPLANT
KIT BASIN OR (CUSTOM PROCEDURE TRAY) ×3 IMPLANT
KIT TURNOVER KIT B (KITS) ×3 IMPLANT
MILL MEDIUM DISP (BLADE) IMPLANT
NEEDLE HYPO 21X1.5 SAFETY (NEEDLE) ×3 IMPLANT
NEEDLE HYPO 22GX1.5 SAFETY (NEEDLE) ×3 IMPLANT
NS IRRIG 1000ML POUR BTL (IV SOLUTION) ×3 IMPLANT
OIL CARTRIDGE MAESTRO DRILL (MISCELLANEOUS) ×3
PACK LAMINECTOMY NEURO (CUSTOM PROCEDURE TRAY) ×3 IMPLANT
PAD ARMBOARD 7.5X6 YLW CONV (MISCELLANEOUS) ×9 IMPLANT
PATTIES SURGICAL .5 X1 (DISPOSABLE) IMPLANT
ROD REVERE 6.35 45MM (Rod) ×6 IMPLANT
SCREW 7.5X50MM (Screw) ×12 IMPLANT
SPACER ALTERA 10X31 8-12MM-8 (Spacer) ×3 IMPLANT
SPONGE LAP 4X18 RFD (DISPOSABLE) ×3 IMPLANT
SPONGE NEURO XRAY DETECT 1X3 (DISPOSABLE) IMPLANT
SPONGE SURGIFOAM ABS GEL 100 (HEMOSTASIS) ×3 IMPLANT
STRIP BIOACTIVE 10CC 25X50X8 (Miscellaneous) ×3 IMPLANT
STRIP CLOSURE SKIN 1/2X4 (GAUZE/BANDAGES/DRESSINGS) ×2 IMPLANT
SUT VIC AB 1 CT1 18XBRD ANBCTR (SUTURE) ×2 IMPLANT
SUT VIC AB 1 CT1 8-18 (SUTURE) ×4
SUT VIC AB 2-0 CP2 18 (SUTURE) ×6 IMPLANT
SYR 20CC LL (SYRINGE) ×3 IMPLANT
TOWEL GREEN STERILE (TOWEL DISPOSABLE) ×3 IMPLANT
TOWEL GREEN STERILE FF (TOWEL DISPOSABLE) ×3 IMPLANT
TRAY FOLEY MTR SLVR 16FR STAT (SET/KITS/TRAYS/PACK) ×3 IMPLANT
WATER STERILE IRR 1000ML POUR (IV SOLUTION) ×3 IMPLANT

## 2018-11-05 NOTE — H&P (Signed)
Subjective: The patient is a 57 year old white male on whom I performed an L3-4 discectomy . The patient initially did well but has developed recurrent and worsening back and leg pain. He has failed medical management and was worked up with a lumbar MRI. This demonstrated disc degeneration and recurrent ruptured disc at L3-4. I discussed the merits treatment options. The patient has decided to proceed with surgery.  Past Medical History:  Diagnosis Date  . Arthritis    gouty  . GERD (gastroesophageal reflux disease)   . History of esophageal stricture    W/ DILATION  . HTN (hypertension)   . 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 Pneumonia   . Right ureteral stone   . Seasonal allergies     Past Surgical History:  Procedure Laterality Date  . BACK SURGERY  2019   lumber 3-4  . COLONOSCOPY N/A 09/20/2013   Procedure: COLONOSCOPY;  Surgeon: West BaliSandi L Fields, MD;  [facility];  Service: Endoscopy;  Laterality: N/A;  8:30  . CYSTOSCOPY WITH RETROGRADE PYELOGRAM, URETEROSCOPY AND STENT PLACEMENT Right 01/06/2016   Procedure: CYSTOSCOPY WITH RETROGRADE PYELOGRAM, URETEROSCOPY AND POSSIBLE STENT PLACEMENT;  Surgeon: Barron Alvineavid Grapey, MD;  [facility];  Service: Urology;  Laterality: Right;  . ESOPHAGOGASTRODUODENOSCOPY N/A 09/20/2013   Procedure: ESOPHAGOGASTRODUODENOSCOPY (EGD);  Surgeon: West BaliSandi L Fields, MD;  [facility];  Service: Endoscopy;  Laterality: N/A;  . ESOPHAGOGASTRODUODENOSCOPY     SLF: Distal esophageal stricture.The area had surrounding mild erythema c/w impacted food bolus which was previously lodged and passed. The distal esophagus was dilated  with the Savary dilators from 12.8 mm to 16 mm/Normal duodenal bulb and second portion of the duodenum   . HOLMIUM LASER APPLICATION Right 01/06/2016   Procedure: HOLMIUM LASER APPLICATION;  Surgeon: Barron Alvineavid Grapey, MD;  [facility] [place];  Service: Urology;  Laterality: Right;  .  MALONEY DILATION N/A 09/20/2013   Procedure: MALONEY DILATION;  Surgeon: West BaliSandi L Fields, MD;  [facility];  Service: Endoscopy;  Laterality: N/A;  . SAVORY DILATION N/A 09/20/2013   Procedure: SAVORY DILATION;  Surgeon: West BaliSandi L Fields, MD;  [facility];  Service: Endoscopy;  Laterality: N/A;  . TONSILLECTOMY    . VASECTOMY    . WISDOM TOOTH EXTRACTION      Allergies  Allergen Reactions  . Lisinopril Other (See Comments)    Felt bad, body aches    Social History   Tobacco Use  . Smoking status: Never Smoker  . Smokeless tobacco: Never Used  Substance Use Topics  . Alcohol use: No    Family History  Problem Relation Age of Onset  . Diabetes Mother   . Stroke Mother   . Hypertension Mother   . Cancer Father   . Hypertension Father   . Esophageal cancer Father   . Colon cancer Maternal Grandfather        age 8450s   Prior to Admission medications   Medication Sig Start Date End Date Taking? Authorizing Provider  anastrozole (ARIMIDEX) 1 MG tablet Take 0.5 mg by mouth daily.   Yes [provider]  Black Currant Seed Oil 500 MG CAPS Take 500 mg by mouth 2 (two) times daily.   Yes [provider]  Cholecalciferol (VITAMIN D3) 125 MCG (5000 UT) CAPS Take 10,000 Units by mouth daily.    Yes [provider]  DHEA 25 MG CAPS Take 25 mg by mouth 4 (four) times  a week.   Yes [provider]  MAGNESIUM PO Take 0.5 Doses by mouth See admin instructions. Mix 0.5 teaspoonful and drink daily   Yes [provider]  Misc Natural Products (TART CHERRY ADVANCED PO) Take 0.75 Doses by mouth See admin instructions. Mix 3/4 teaspoonful in 1 tablespoon of Apple Cider Vinegar and drink once daily   Yes [provider]  Multiple Vitamins-Minerals (ZINC PO) Take 0.25 Doses by mouth See admin instructions. Mix 0.25 teaspoonful and drink daily   Yes [provider]  olmesartan (BENICAR) 20 MG tablet Take 20 mg by mouth  daily.   Yes [provider]  OVER THE COUNTER MEDICATION Take 1 tablet by mouth 2 (two) times daily. Blood Sugar Synergy Supplement   Yes [provider]  OVER THE COUNTER MEDICATION Take 1 tablet by mouth 2 (two) times daily. Circulation Synergy Supplement   Yes [provider]  OVER THE COUNTER MEDICATION Take 1 tablet by mouth 2 (two) times daily. Liver Refresh Supplement   Yes [provider]  OVER THE COUNTER MEDICATION Take 1 tablet by mouth 2 (two) times daily. Estrogen Control Supplement   Yes [provider]  OVER THE COUNTER MEDICATION Apply 1 application topically daily. Glutathione Cream   Yes [provider]  PRESCRIPTION MEDICATION Take 1 [place] by mouth daily. Testosterone 15mg    Yes [provider]  Turmeric 500 MG TABS Take 500 mg by mouth daily with supper.    Yes [provider]  vitamin k 100 MCG tablet Take 150 mcg by mouth daily.    Yes [provider]     Review of Systems  Positive ROS: as above  All other systems have been reviewed and were otherwise negative with the exception of those mentioned in the HPI and as above.  Objective: Vital signs in last 24 hours: Temp:  [98.7 F (37.1 C)] 98.7 F (37.1 C) (12/16 1127) Pulse Rate:  [63] 63 (12/16 1127) Resp:  [20] 20 (12/16 1127) BP: (131)/(100) 131/100 (12/16 1127) SpO2:  [100 %] 100 % (12/16 1127) Weight:  [117.9 kg] 117.9 kg (12/16 1127) Estimated body mass index is 34.3 kg/m as calculated from the following:   Height as of this encounter: 6\' 1"  (1.854 m).   Weight as of this encounter: 117.9 kg.   General Appearance: Alert Head: Normocephalic, without obvious abnormality, atraumatic Eyes: PERRL, conjunctiva/corneas clear, EOM's intact,    Ears: Normal  Throat: Normal  Neck: Supple, Back: the patient lumbar incision is well-healed. Straight leg is testing is positive on the right. Lungs: Clear to auscultation  bilaterally, respirations unlabored Heart: Regular rate and rhythm, no murmur, rub or gallop Abdomen: Soft, non-tender Extremities: Extremities normal, atraumatic, no cyanosis or edema Skin: unremarkable  NEUROLOGIC:   Mental status: alert and oriented,Motor Exam - grossly normal Sensory Exam - grossly normal Reflexes:  Coordination - grossly normal Gait - grossly normal Balance - grossly normal Cranial Nerves: I: smell Not tested  II: visual acuity  OS: Normal  OD: Normal   II: visual fields Full to confrontation  II: pupils Equal, round, reactive to light  III,VII: ptosis None  III,IV,VI: extraocular muscles  Full ROM  V: mastication Normal  V: facial light touch sensation  Normal  V,VII: corneal reflex  Present  VII: facial muscle function - upper  Normal  VII: facial muscle function - lower Normal  VIII: hearing Not tested  IX: soft palate elevation  Normal  IX,X: gag reflex  Present  XI: trapezius strength  5/5  XI: sternocleidomastoid strength 5/5  XI: neck flexion strength  5/5  XII: tongue strength  Normal    Data Review Lab Results  Component Value Date   WBC 7.4 10/26/2018   HGB 15.8 10/26/2018   HCT 47.0 10/26/2018   MCV 84.8 10/26/2018   PLT 262 10/26/2018   Lab Results  Component Value Date   NA 138 10/26/2018   K 4.2 10/26/2018   CL 103 10/26/2018   CO2 24 10/26/2018   BUN 10 10/26/2018   CREATININE 0.99 10/26/2018   GLUCOSE 100 (H) 10/26/2018   No results found for: INR, PROTIME  Assessment/Plan: L3-4 degenerative disc disease, recurrent herniated disc, lumbago, lumbar radiculopathy: I have discussed the situation with the patient and wife. I reviewed his imaging studies with them ) and now the abnormalities.I discussed the various treatment options including surgery. I have described the surgical treatment option of an L3-4 decompression, instrumentation, and fusion. I have shown him surgical models. I have given him a surgical pamphlett. We  have discussed the risks, benefits, alternatives, expected postoperative course, and likelihood of achieving old surgery. I have answered all the patient's and his wife's, questions. He has decided proceed with surgery.   Cristi Loron 11/05/2018 12:46 PM

## 2018-11-05 NOTE — Anesthesia Preprocedure Evaluation (Addendum)
Anesthesia Evaluation  Patient identified by MRN, date of birth, ID band Patient awake    Reviewed: Allergy & Precautions, H&P , NPO status , Patient's Chart, lab work & pertinent test results  Airway Mallampati: II  TM Distance: >3 FB Neck ROM: Full    Dental no notable dental hx. (+) Teeth Intact, Dental Advisory Given   Pulmonary neg pulmonary ROS,    Pulmonary exam normal breath sounds clear to auscultation       Cardiovascular hypertension, Pt. on medications  Rhythm:Regular Rate:Normal     Neuro/Psych negative neurological ROS  negative psych ROS   GI/Hepatic Neg liver ROS, GERD  Controlled,  Endo/Other  negative endocrine ROS  Renal/GU negative Renal ROS  negative genitourinary   Musculoskeletal   Abdominal   Peds  Hematology negative hematology ROS (+)   Anesthesia Other Findings   Reproductive/Obstetrics negative OB ROS                            Anesthesia Physical Anesthesia Plan  ASA: II  Anesthesia Plan: General   Post-op Pain Management:    Induction: Intravenous  PONV Risk Score and Plan: 3 and Ondansetron, Dexamethasone and Midazolam  Airway Management Planned: Oral ETT  Additional Equipment:   Intra-op Plan:   Post-operative Plan: Extubation in OR  Informed Consent: I have reviewed the patients History and Physical, chart, labs and discussed the procedure including the risks, benefits and alternatives for the proposed anesthesia with the patient or authorized representative who has indicated his/her understanding and acceptance.   Dental advisory given  Plan Discussed with: CRNA  Anesthesia Plan Comments:         Anesthesia Quick Evaluation

## 2018-11-05 NOTE — Op Note (Signed)
Brief history: The patient is a 57 year old white male on whom I previously performed an L3-4 discectomy.  He initially did well but developed recurrent back and leg pain.  He failed medical management.  He was worked up with a lumbar MRI which demonstrated a recurrent herniated disc at L3-4 with degenerative disc disease.  I discussed the various treatment options with the patient including surgery.  He has weighed the risks, benefits and alternatives of surgery and decided to proceed with a L3-4 decompression, instrumentation and fusion.  Preoperative diagnosis: L3-4 recurrent herniated disc, degenerative disc disease, lumbar foraminal stenosis; lumbago; lumbar radiculopathy  Postoperative diagnosis: The same  Procedure: Redo L3-4 laminotomy/foraminotomies/medial facetectomy to decompress the bilateral L3 and L4 nerve roots(the work required to do this was in addition to the work required to do the posterior lumbar interbody fusion because of the recurrent ruptured disks and foraminal stenosis etc. requiring a wide decompression of the nerve roots.);  L3-4 transforaminal lumbar interbody fusion with local morselized autograft bone and Kinnex graft extender; insertion of interbody prosthesis at L3-4 (globus peek expandable interbody prosthesis); posterior nonsegmental instrumentation from L3 to L4 with globus titanium pedicle screws and rods; posterior lateral arthrodesis at L3-4 with local morselized autograft bone and Kinnex bone graft extender.  Surgeon: Dr. Delma Officer  Asst.: Hildred Priest nurse practitioner  Anesthesia: Gen. endotracheal  Estimated blood loss: 200 cc  Drains: None  Complications: None  Description of procedure: The patient was brought to the operating room by the anesthesia team. General endotracheal anesthesia was induced. The patient was turned to the prone position on the Wilson frame. The patient's lumbosacral region was then prepared with Betadine scrub and Betadine  solution. Sterile drapes were applied.  I then injected the area to be incised with Marcaine with epinephrine solution. I then used the scalpel to make a linear midline incision over the L3-4 interspace, incising through the old surgical scar. I then used electrocautery to perform a bilateral subperiosteal dissection exposing the spinous process and lamina of L3 and L4. We then obtained intraoperative radiograph to confirm our location. We then inserted the Verstrac retractor to provide exposure.  I began the decompression by using the high speed drill to perform laminotomies at L3-4 bilaterally. We then used the Kerrison punches to widen the laminotomy and removed the remainder of the ligamentum flavum and the scar tissue at L3-4 on the right and the ligamentum flavum on the left. We used the Kerrison punches to remove the medial facets at L3-4 bilaterally. We performed wide foraminotomies about the bilateral L3 and L4 nerve roots completing the decompression.  We now turned our attention to the posterior lumbar interbody fusion. I used a scalpel to incise the intervertebral disc at L3-4 bilaterally. I then performed a redo right intervertebral discectomy at L3-4 using the pituitary forceps.  We also remove the disc at L3-4 on the left.  We prepared the vertebral endplates at L3-4 bilaterally for the fusion by removing the soft tissues with the curettes. We then used the trial spacers to pick the appropriate sized interbody prosthesis. We prefilled his prosthesis with a combination of local morselized autograft bone that we obtained during the decompression as well as Kinnex bone graft extender. We inserted the prefilled prosthesis into the interspace at L3-4 bilaterally, we then turned and expanded the prosthesis. There was a good snug fit of the prosthesis in the interspace. We then filled and the remainder of the intervertebral disc space with local morselized autograft bone  and Kinnex. This completed the  posterior lumbar interbody arthrodesis.  We now turned attention to the instrumentation. Under fluoroscopic guidance we cannulated the bilateral L3 and L4 pedicles with the bone probe. We then removed the bone probe. We then tapped the pedicle with a 6.5 millimeter tap. We then removed the tap. We probed inside the tapped pedicle with a ball probe to rule out cortical breaches. We then inserted a 7.5 x 50 millimeter pedicle screw into the L3 and L4 pedicles bilaterally under fluoroscopic guidance. We then palpated along the medial aspect of the pedicles to rule out cortical breaches. There were none. The nerve roots were not injured. We then connected the unilateral pedicle screws with a lordotic rod. We compressed the construct and secured the rod in place with the caps. We then tightened the caps appropriately. This completed the instrumentation from L3-4 bilaterally.  We now turned our attention to the posterior lateral arthrodesis at L3-4 bilaterally. We used the high-speed drill to decorticate the remainder of the facets, pars, transverse process at L3-4 bilaterally. We then applied a combination of local morselized autograft bone and Kinnex bone graft extender over these decorticated posterior lateral structures. This completed the posterior lateral arthrodesis.  We then obtained hemostasis using bipolar electrocautery. We irrigated the wound out with bacitracin solution. We inspected the thecal sac and nerve roots and noted they were well decompressed. We then removed the retractor. We placed vancomycin powder in the wound.  We injected Exparel . We reapproximated patient's thoracolumbar fascia with interrupted #1 Vicryl suture. We reapproximated patient's subcutaneous tissue with interrupted 2-0 Vicryl suture. The reapproximated patient's skin with Steri-Strips and benzoin. The wound was then coated with bacitracin ointment. A sterile dressing was applied. The drapes were removed. The patient was  subsequently returned to the supine position where they were extubated by the anesthesia team. He was then transported to the post anesthesia care unit in stable condition. All sponge instrument and needle counts were reportedly correct at the end of this case.

## 2018-11-05 NOTE — Transfer of Care (Signed)
Immediate Anesthesia Transfer of Care Note  Patient: Michael LundStephen B Cochran  Procedure(s) Performed: POSTERIOR LUMBAR INTERBODY FUSION, INTERBODY PROSTHESIS, POSTERIOR INSTRUMENTATION LUMBAR THREE- LUMBAR FOUR (N/A Back)  Patient Location: PACU  Anesthesia Type:General  Level of Consciousness: awake and alert   Airway & Oxygen Therapy: Patient Spontanous Breathing and Patient connected to nasal cannula oxygen  Post-op Assessment: Report given to RN and Post -op Vital signs reviewed and stable  Post vital signs: Reviewed and stable  Last Vitals:  Vitals Value Taken Time  BP 113/68 11/05/2018  5:50 PM  Temp 36.4 C 11/05/2018  5:50 PM  Pulse 92 11/05/2018  5:52 PM  Resp 19 11/05/2018  5:52 PM  SpO2 94 % 11/05/2018  5:52 PM  Vitals shown include unvalidated device data.  Last Pain:  Vitals:   11/05/18 1207  TempSrc:   PainSc: 0-No pain         Complications: No apparent anesthesia complications

## 2018-11-05 NOTE — Anesthesia Procedure Notes (Signed)
Procedure Name: Intubation Date/Time: 11/05/2018 2:38 PM Performed by: Eligha Bridegroom, CRNA Pre-anesthesia Checklist: Patient identified, Emergency Drugs available, Suction available, Patient being monitored and Timeout performed Patient Re-evaluated:Patient Re-evaluated prior to induction Oxygen Delivery Method: Circle system utilized Preoxygenation: Pre-oxygenation with 100% oxygen Induction Type: IV induction Ventilation: Mask ventilation without difficulty and Oral airway inserted - appropriate to patient size Laryngoscope Size: Mac and 4 Grade View: Grade I Tube type: Oral Tube size: 7.5 mm Number of attempts: 1 Airway Equipment and Method: Stylet Placement Confirmation: ETT inserted through vocal cords under direct vision,  positive ETCO2 and breath sounds checked- equal and bilateral Secured at: 23 cm Tube secured with: Tape Dental Injury: Teeth and Oropharynx as per pre-operative assessment

## 2018-11-05 NOTE — Plan of Care (Signed)
  Problem: Education: Goal: Ability to verbalize activity precautions or restrictions will improve Outcome: Progressing Goal: Knowledge of the prescribed therapeutic regimen will improve Outcome: Progressing Goal: Understanding of discharge needs will improve Outcome: Progressing   Problem: Activity: Goal: Ability to avoid complications of mobility impairment will improve Outcome: Progressing Goal: Ability to tolerate increased activity will improve Outcome: Progressing Goal: Will remain free from falls Outcome: Progressing   Problem: Pain Management: Goal: Pain level will decrease Outcome: Progressing   Problem: Skin Integrity: Goal: Will show signs of wound healing Outcome: Progressing   Problem: Bladder/Genitourinary: Goal: Urinary functional status for postoperative course will improve Outcome: Progressing   

## 2018-11-05 NOTE — Progress Notes (Signed)
Subjective: The patient is alert and pleasant.  He looks well.  He is in no apparent distress.  Objective: Vital signs in last 24 hours: Temp:  [97.5 F (36.4 C)-98.7 F (37.1 C)] 97.5 F (36.4 C) (12/16 1750) Pulse Rate:  [63-95] 95 (12/16 1750) Resp:  [14-20] 14 (12/16 1750) BP: (113-131)/(68-100) 113/68 (12/16 1750) SpO2:  [98 %-100 %] 98 % (12/16 1750) Weight:  [117.9 kg] 117.9 kg (12/16 1127) Estimated body mass index is 34.3 kg/m as calculated from the following:   Height as of this encounter: 6\' 1"  (1.854 m).   Weight as of this encounter: 117.9 kg.   Intake/Output from previous day: No intake/output data recorded. Intake/Output this shift: Total I/O In: 2000 [I.V.:2000] Out: 250 [Urine:100; Blood:150]  Physical exam the patient is alert and pleasant.  He is moving his lower extremities well.  Lab Results: No results for input(s): WBC, HGB, HCT, PLT in the last 72 hours. BMET No results for input(s): NA, K, CL, CO2, GLUCOSE, BUN, CREATININE, CALCIUM in the last 72 hours.  Studies/Results: Dg Lumbar Spine 2-3 Views  Result Date: 11/05/2018 CLINICAL DATA:  57 year old male. Fusion L3-4. Subsequent encounter. EXAM: DG C-ARM 61-120 MIN; LUMBAR SPINE - 2-3 VIEW Fluoroscopic time: 19 seconds. COMPARISON:  Intraoperative lateral view of the lumbar spine 11/05/2018. Preoperative MR 09/30/2018. FINDINGS: Three intraoperative C-arm views submitted for review after surgery. Bilateral pedicle screws and interbody spacer placed. I suspect this is at the L3-4 level given the degenerative changes at the L2-3 level matching preoperative MR. This level assignment can be confirmed on follow-up (including the lumbosacral junction). IMPRESSION: Probable fusion L3-4 as noted above. Electronically Signed   By: Lacy DuverneySteven  Olson M.D.   On: 11/05/2018 17:45   Dg Lumbar Spine 1 View  Result Date: 11/05/2018 CLINICAL DATA:  57 year old male for L3-4 fusion. Subsequent encounter. EXAM: LUMBAR  SPINE - 1 VIEW COMPARISON:  09/30/2018 MR. FINDINGS: Single intraoperative lateral view of the lumbar spine submitted for review after surgery. Utilizing level assignment on prior MR with last fully open disc space at the L5-S1 level, inferior metallic probe is directed towards the L4 pedicle with surgical rakes posterior to the L3-4 disc space. Surgical sponge(s) in place. IMPRESSION: Localization L3-4. Electronically Signed   By: Lacy DuverneySteven  Olson M.D.   On: 11/05/2018 17:43   Dg C-arm 1-60 Min  Result Date: 11/05/2018 CLINICAL DATA:  57 year old male. Fusion L3-4. Subsequent encounter. EXAM: DG C-ARM 61-120 MIN; LUMBAR SPINE - 2-3 VIEW Fluoroscopic time: 19 seconds. COMPARISON:  Intraoperative lateral view of the lumbar spine 11/05/2018. Preoperative MR 09/30/2018. FINDINGS: Three intraoperative C-arm views submitted for review after surgery. Bilateral pedicle screws and interbody spacer placed. I suspect this is at the L3-4 level given the degenerative changes at the L2-3 level matching preoperative MR. This level assignment can be confirmed on follow-up (including the lumbosacral junction). IMPRESSION: Probable fusion L3-4 as noted above. Electronically Signed   By: Lacy DuverneySteven  Olson M.D.   On: 11/05/2018 17:45    Assessment/Plan: The patient is doing well.  I spoke with his wife.  LOS: 0 days     Cristi LoronJeffrey D Aviannah Castoro 11/05/2018, 6:11 PM

## 2018-11-06 LAB — CBC
HCT: 40.3 % (ref 39.0–52.0)
Hemoglobin: 13.4 g/dL (ref 13.0–17.0)
MCH: 29 pg (ref 26.0–34.0)
MCHC: 33.3 g/dL (ref 30.0–36.0)
MCV: 87.2 fL (ref 80.0–100.0)
Platelets: 245 10*3/uL (ref 150–400)
RBC: 4.62 MIL/uL (ref 4.22–5.81)
RDW: 13.2 % (ref 11.5–15.5)
WBC: 11.6 10*3/uL — ABNORMAL HIGH (ref 4.0–10.5)
nRBC: 0 % (ref 0.0–0.2)

## 2018-11-06 LAB — BASIC METABOLIC PANEL
Anion gap: 13 (ref 5–15)
BUN: 17 mg/dL (ref 6–20)
CO2: 26 mmol/L (ref 22–32)
Calcium: 9.2 mg/dL (ref 8.9–10.3)
Chloride: 99 mmol/L (ref 98–111)
Creatinine, Ser: 1.57 mg/dL — ABNORMAL HIGH (ref 0.61–1.24)
GFR calc Af Amer: 57 mL/min — ABNORMAL LOW (ref >60)
GFR, EST NON AFRICAN AMERICAN: 49 mL/min — AB (ref >60)
Glucose, Bld: 148 mg/dL — ABNORMAL HIGH (ref 70–99)
POTASSIUM: 4.4 mmol/L (ref 3.5–5.1)
SODIUM: 138 mmol/L (ref 135–145)

## 2018-11-06 MED ORDER — OXYCODONE HCL 5 MG PO TABS: 5.0000 mg | ORAL_TABLET | ORAL | 0 refills | Status: AC | PRN

## 2018-11-06 MED ORDER — DOCUSATE SODIUM 100 MG PO CAPS: 100.0000 mg | ORAL_CAPSULE | Freq: Two times a day (BID) | ORAL | 0 refills | Status: AC

## 2018-11-06 MED ORDER — CYCLOBENZAPRINE HCL 10 MG PO TABS: 10.0000 mg | ORAL_TABLET | Freq: Three times a day (TID) | ORAL | 0 refills | Status: AC | PRN

## 2018-11-06 NOTE — Discharge Instructions (Signed)

## 2018-11-06 NOTE — Evaluation (Signed)
Physical Therapy Evaluation Patient Details Name: Michael Cochran MRN: [id number] DOB: 1961/11/08 Today's Date: 11/06/2018   History of Present Illness  57yo male s/p L3-4 redo- L3-4 discectomy,, instrumentation, and fusion. PSH: back surgery in june 2019.  Clinical Impression  Patient is s/p above surgery resulting in the deficits listed below (see PT Problem List). Pt with questions regarding sex. Pt instructed to ask MD regarding when he can resume having sexual intercourse and to minimize arching of back when returning. Pt functioning at supervision level of mobility. Patient will benefit from skilled PT to increase their independence and safety with mobility (while adhering to their precautions) to allow discharge to the venue listed below.     Follow Up Recommendations No PT follow up;Supervision/Assistance - 24 hour    Equipment Recommendations  3in1 (PT)    Recommendations for Other Services       Precautions / Restrictions Precautions Precautions: Back Precaution Booklet Issued: Yes (comment) Precaution Comments: pt with good understanding Required Braces or Orthoses: Spinal Brace Spinal Brace: Lumbar corset;Applied in sitting position(pt able to don properly without breaking precautions) Restrictions Weight Bearing Restrictions: No      Mobility  Bed Mobility Overal bed mobility: Needs Assistance Bed Mobility: Rolling;Sidelying to Sit;Sit to Sidelying Rolling: Supervision Sidelying to sit: Supervision     Sit to sidelying: Supervision General bed mobility comments: verbal cues for technique  Transfers Overall transfer level: Needs assistance Equipment used: None Transfers: Sit to/from Stand Sit to Stand: Supervision         General transfer comment: verbal cues to minimize bending and to contract abdominal muscles to support back  Ambulation/Gait Ambulation/Gait assistance: Supervision Gait Distance (Feet): 200 Feet Assistive device: Straight  cane Gait Pattern/deviations: Step-through pattern;Decreased stance time - left;Trunk flexed Gait velocity: dec Gait velocity interpretation: >2.62 ft/sec, indicative of community ambulatory General Gait Details: verbal cues to maintain trunk extension, contract abdominal muscles, pt not really using cane at support just for balance  Stairs Stairs: Yes Stairs assistance: Min guard Stair Management: One rail Left;Alternating pattern Number of Stairs: 4 General stair comments: pt with good technique  Wheelchair Mobility    Modified Rankin (Stroke Patients Only)       Balance Overall balance assessment: No apparent balance deficits (not formally assessed)                                           Pertinent Vitals/Pain Pain Assessment: 0-10 Pain Score: 2  Pain Location: back Pain Descriptors / Indicators: Discomfort Pain Intervention(s): Monitored during session    Home Living Family/patient expects to be discharged to:: Private residence Living Arrangements: Spouse/significant other Available Help at Discharge: Family;Available 24 hours/day Type of Home: House Home Access: Stairs to enter Entrance Stairs-Rails: Left Entrance Stairs-Number of Steps: 3 Home Layout: One level Home Equipment: Cane - single point      Prior Function Level of Independence: Independent         Comments: used straight cane intermittently     Hand Dominance   Dominant Hand: Right    Extremity/Trunk Assessment   Upper Extremity Assessment Upper Extremity Assessment: Overall WFL for tasks assessed    Lower Extremity Assessment Lower Extremity Assessment: Overall WFL for tasks assessed    Cervical / Trunk Assessment Cervical / Trunk Assessment: Other exceptions Cervical / Trunk Exceptions: recent back surgery  Communication   Communication: No difficulties  Cognition Arousal/Alertness: Awake/alert Behavior During Therapy: WFL for tasks  assessed/performed Overall Cognitive Status: Within Functional Limits for tasks assessed                                        General Comments General comments (skin integrity, edema, etc.): VSS    Exercises     Assessment/Plan    PT Assessment Patient needs continued PT services  PT Problem List Decreased strength;Decreased activity tolerance;Decreased balance;Decreased mobility;Decreased knowledge of use of DME;Decreased safety awareness       PT Treatment Interventions DME instruction;Gait training;Stair training;Functional mobility training;Therapeutic activities;Therapeutic exercise    PT Goals (Current goals can be found in the Care Plan section)  Acute Rehab PT Goals Patient Stated Goal: not have surgery again PT Goal Formulation: With patient Time For Goal Achievement: 11/20/18 Potential to Achieve Goals: Good    Frequency Min 5X/week   Barriers to discharge        Co-evaluation               AM-PAC PT "6 Clicks" Mobility  Outcome Measure Help needed turning from your back to your side while in a flat bed without using bedrails?: None Help needed moving from lying on your back to sitting on the side of a flat bed without using bedrails?: None Help needed moving to and from a bed to a chair (including a wheelchair)?: None Help needed standing up from a chair using your arms (e.g., wheelchair or bedside chair)?: None Help needed to walk in hospital room?: A Little Help needed climbing 3-5 steps with a railing? : A Little 6 Click Score: 22    End of Session Equipment Utilized During Treatment: Back brace Activity Tolerance: Patient tolerated treatment well Patient left: in chair;with call bell/phone within reach Nurse Communication: Mobility status PT Visit Diagnosis: Unsteadiness on feet (R26.81);Difficulty in walking, not elsewhere classified (R26.2)    Time: 1610-9604 PT Time Calculation (min) (ACUTE ONLY): 27 min   Charges:    PT Evaluation $PT Eval Moderate Complexity: 1 Mod PT Treatments $Gait Training: 8-22 mins        Lewis Shock, PT, DPT Acute Rehabilitation Services Pager #: [phone] Office #: [phone]   Michael Cochran 11/06/2018, 8:12 AM

## 2018-11-06 NOTE — Anesthesia Postprocedure Evaluation (Signed)
Anesthesia Post Note  Patient: Michael LundStephen B Gest  Procedure(s) Performed: POSTERIOR LUMBAR INTERBODY FUSION, INTERBODY PROSTHESIS, POSTERIOR INSTRUMENTATION LUMBAR THREE- LUMBAR FOUR (N/A Back)     Patient location during evaluation: PACU Anesthesia Type: General Level of consciousness: awake and alert Pain management: pain level controlled Vital Signs Assessment: post-procedure vital signs reviewed and stable Respiratory status: spontaneous breathing, nonlabored ventilation and respiratory function stable Cardiovascular status: blood pressure returned to baseline and stable Postop Assessment: no apparent nausea or vomiting Anesthetic complications: no    Last Vitals:  Vitals:   11/05/18 2308 11/06/18 0345  BP: 112/71 104/69  Pulse: (!) 57 70  Resp: 20 20  Temp: 36.8 C 36.7 C  SpO2: 95% 96%    Last Pain:  Vitals:   11/06/18 0345  TempSrc: Oral  PainSc:                  Rufus Beske,W. EDMOND

## 2018-11-06 NOTE — Evaluation (Signed)
Occupational Therapy Evaluation Patient Details Name: Michael Cochran MRN: [id number] DOB: 1961/03/22 Today's Date: 11/06/2018    History of Present Illness 57yo male s/p L3-4 redo- L3-4 discectomy,, instrumentation, and fusion. PSH: back surgery in june 2019.   Clinical Impression   This 57 y/o male presents with the above. At baseline pt is independent-mod independent with ADLs and functional mobility, reports intermittent use of SPC. Pt performing functional mobility in room and hallway using SPC this session at overall supervision level. He currently requires setup assist for UB ADL, minguard assist for LB ADLs. Reviewed back precautions, brace management, safety and compensatory strategies for performing ADLs and functional transfers with pt verbalizing and return demonstrating understanding. Pt reports will return home with assistance from spouse. Questions answered throughout with no further acute OT needs identified at this time. Feel pt is safe to return home from OT standpoint once medically ready given available family assist. Acute OT to sign off, thank you for this referral.     Follow Up Recommendations  No OT follow up;Supervision - Intermittent    Equipment Recommendations  3 in 1 bedside commode           Precautions / Restrictions Precautions Precautions: Back Precaution Booklet Issued: Yes (comment) Precaution Comments: pt with good understanding Required Braces or Orthoses: Spinal Brace Spinal Brace: Lumbar corset;Applied in sitting position Restrictions Weight Bearing Restrictions: No      Mobility Bed Mobility Overal bed mobility: Needs Assistance Bed Mobility: Rolling;Sidelying to Sit;Sit to Sidelying Rolling: Supervision Sidelying to sit: Supervision     Sit to sidelying: Supervision General bed mobility comments: OOB standing in room upon arrival, pt verbalizes understanding of log roll technique  Transfers Overall transfer level: Needs  assistance Equipment used: None Transfers: Sit to/from Stand Sit to Stand: Supervision         General transfer comment: for general safety and immediate standing balance    Balance Overall balance assessment: No apparent balance deficits (not formally assessed)                                         ADL either performed or assessed with clinical judgement   ADL Overall ADL's : Needs assistance/impaired Eating/Feeding: Modified independent;Sitting   Grooming: Supervision/safety;Standing   Upper Body Bathing: Sitting;Set up;Standing   Lower Body Bathing: Min guard;Sit to/from stand Lower Body Bathing Details (indicate cue type and reason): educated on use of LH sponge to increase ease of accessing LEs while maintaining precautions; pt verbalizes will most likely stand to perform task, emphasized safety with standing during bathing task; also educated on use of 3:1 as shower chair for increased safety Upper Body Dressing : Set up;Sitting Upper Body Dressing Details (indicate cue type and reason): pt verbalizes understanding of brace management  Lower Body Dressing: Min guard;Sit to/from stand Lower Body Dressing Details (indicate cue type and reason): able to perform firgure 4 technique; empahsized maintaining back precautions during LB task Toilet Transfer: Min guard;Supervision/safety;Ambulation;BSC Toilet Transfer Details (indicate cue type and reason): over toilet; simulated in transfer to/from EOB Toileting- Clothing Manipulation and Hygiene: Min guard;Sit to/from Nurse, children's Details (indicate cue type and reason): verbally reviewed safe transfer techniques  Functional mobility during ADLs: Min guard;Supervision/safety;Cane General ADL Comments: reviewed back precautions, safety, AE and compensatory strategies for performing ADLs and functional transfers while maintaining precautions; spoke with PT prior to  start of session and pt with  questions regarding sex after surgery, issued pt handout and reviewed safety with task and safe positioning given recent spinal surgery, recommend pt wait until follow up with MD/cleared for activity by MD prior to initiating and pt verbalizing understanding/agreement     Vision         Perception     Praxis      Pertinent Vitals/Pain Pain Assessment: Faces Pain Score: 2  Faces Pain Scale: Hurts a little bit Pain Location: back Pain Descriptors / Indicators: Discomfort Pain Intervention(s): Monitored during session     Hand Dominance Right   Extremity/Trunk Assessment Upper Extremity Assessment Upper Extremity Assessment: Overall WFL for tasks assessed   Lower Extremity Assessment Lower Extremity Assessment: Overall WFL for tasks assessed   Cervical / Trunk Assessment Cervical / Trunk Assessment: Other exceptions Cervical / Trunk Exceptions: recent back surgery   Communication Communication Communication: No difficulties   Cognition Arousal/Alertness: Awake/alert Behavior During Therapy: WFL for tasks assessed/performed Overall Cognitive Status: Within Functional Limits for tasks assessed                                     General Comments  VSS    Exercises     Shoulder Instructions      Home Living Family/patient expects to be discharged to:: Private residence Living Arrangements: Spouse/significant other Available Help at Discharge: Family;Available 24 hours/day Type of Home: House Home Access: Stairs to enter Entergy CorporationEntrance Stairs-Number of Steps: 3 Entrance Stairs-Rails: Left Home Layout: One level     Bathroom Shower/Tub: Chief Strategy OfficerTub/shower unit   Bathroom Toilet: Standard     Home Equipment: Cane - single point          Prior Functioning/Environment Level of Independence: Independent        Comments: used straight cane intermittently        OT Problem List: Decreased range of motion;Decreased strength;Decreased knowledge of  precautions;Decreased activity tolerance      OT Treatment/Interventions:      OT Goals(Current goals can be found in the care plan section) Acute Rehab OT Goals Patient Stated Goal: not have surgery again OT Goal Formulation: All assessment and education complete, DC therapy  OT Frequency:     Barriers to D/C:            Co-evaluation              AM-PAC OT "6 Clicks" Daily Activity     Outcome Measure Help from another person eating meals?: None Help from another person taking care of personal grooming?: None Help from another person toileting, which includes using toliet, bedpan, or urinal?: None Help from another person bathing (including washing, rinsing, drying)?: A Little Help from another person to put on and taking off regular upper body clothing?: None Help from another person to put on and taking off regular lower body clothing?: A Little 6 Click Score: 22   End of Session Equipment Utilized During Treatment: Back brace(cane) Nurse Communication: Mobility status  Activity Tolerance: Patient tolerated treatment well Patient left: with call bell/phone within reach;Other (comment)(sitting EOB)  OT Visit Diagnosis: Other abnormalities of gait and mobility (R26.89)                Time: [id number] OT Time Calculation (min): 16 min Charges:  OT General Charges $OT Visit: 1 Visit OT Evaluation $OT Eval Low Complexity: 1 Low  Deklyn Gibbon  Orvan Falconer, OT [facility] Pager [phone] Office [phone]   Orlando Penner 11/06/2018, 10:08 AM

## 2018-11-06 NOTE — Progress Notes (Signed)
Patient is discharged from room 3C02 at this time. Alert and in stable condition. IV site d/c'd and instructions read to patient and spouse with understanding verbalized. Left unit via wheelchair with all belongings at side 

## 2018-11-06 NOTE — Progress Notes (Signed)
Orthopedic Tech Progress Note Patient Details:  Michael Cochran Hagner [birth year] [id number]  Patient ID: Michael Cochran Mayden, male   DOB: [birth year], 57 y.o.   MRN: [id number] RN says pt has brace.  Trinna PostMartinez, Haniyah Maciolek J 11/06/2018, 9:30 AM

## 2018-11-06 NOTE — Discharge Summary (Signed)
Physician Discharge Summary  Patient ID: Michael Cochran MRN: [id number] DOB/AGE: 55-Dec-1962 55 y.o.  Admit date: 11/05/2018 Discharge date: 11/06/2018  Admission Diagnoses: Recurrent lumbar herniated disc, lumbar degenerative disease, lumbar foraminal stenosis, lumbago, lumbar radiculopathy  Discharge Diagnoses: The same   Active Problems:   Recurrent displacement of lumbar disc   Discharged Condition: good  Hospital Course: I performed a redo L3-4 discectomy, instrumentation and fusion on the patient on 11/05/2018.  The surgery went well.  The patient's postoperative course was unremarkable.  On postoperative day #1 he requested discharge home.  He was given written and oral discharge instructions.  All his questions were answered.  Consults: Physical therapy Significant Diagnostic Studies: None Treatments: L3-4 decompression, instrumentation and fusion. Discharge Exam: Blood pressure 104/69, pulse 70, temperature 98 F (36.7 C), temperature source Oral, resp. rate 20, height 6\' 1"  (1.854 m), weight 117.9 kg, SpO2 96 %. The patient is alert and pleasant.  His strength is normal.  His dressing is clean and dry.  Disposition: Home  Discharge Instructions    Call MD for:  difficulty breathing, headache or visual disturbances   Complete by:  As directed    Call MD for:  extreme fatigue   Complete by:  As directed    Call MD for:  hives   Complete by:  As directed    Call MD for:  persistant dizziness or light-headedness   Complete by:  As directed    Call MD for:  persistant nausea and vomiting   Complete by:  As directed    Call MD for:  redness, tenderness, or signs of infection (pain, swelling, redness, odor or green/yellow discharge around incision site)   Complete by:  As directed    Call MD for:  severe uncontrolled pain   Complete by:  As directed    Call MD for:  temperature >100.4   Complete by:  As directed    Diet - low sodium heart healthy   Complete by:  As  directed    Discharge instructions   Complete by:  As directed    Call [phone] for a followup appointment. Take a stool softener while you are using pain medications.   Driving Restrictions   Complete by:  As directed    Do not drive for 2 weeks.   Increase activity slowly   Complete by:  As directed    Lifting restrictions   Complete by:  As directed    Do not lift more than 5 pounds. No excessive bending or twisting.   May shower / Bathe   Complete by:  As directed    Remove the dressing for 3 days after surgery.  You may shower, but leave the incision alone.   Remove dressing in 48 hours   Complete by:  As directed    Your stitches are under the scan and will dissolve by themselves. The Steri-Strips will fall off after you take a few showers. Do not rub back or pick at the wound, Leave the wound alone.     Allergies as of 11/06/2018      Reactions   Lisinopril Other (See Comments)   Felt bad, body aches      Medication List    TAKE these medications   anastrozole 1 MG tablet Commonly known as:  ARIMIDEX Take 0.5 mg by mouth daily.   Black Currant Seed Oil 500 MG Caps Take 500 mg by mouth 2 (two) times daily.   cyclobenzaprine 10 MG  tablet Commonly known as:  FLEXERIL Take 1 tablet (10 mg total) by mouth 3 (three) times daily as needed for muscle spasms.   DHEA 25 MG Caps Take 25 mg by mouth 4 (four) times a week.   docusate sodium 100 MG capsule Commonly known as:  COLACE Take 1 capsule (100 mg total) by mouth 2 (two) times daily.   MAGNESIUM PO Take 0.5 Doses by mouth See admin instructions. Mix 0.5 teaspoonful and drink daily   olmesartan 20 MG tablet Commonly known as:  BENICAR Take 20 mg by mouth daily.   OVER THE COUNTER MEDICATION Take 1 tablet by mouth 2 (two) times daily. Blood Sugar Synergy Supplement   OVER THE COUNTER MEDICATION Take 1 tablet by mouth 2 (two) times daily. Circulation Synergy Supplement   OVER THE COUNTER MEDICATION Take  1 tablet by mouth 2 (two) times daily. Liver Refresh Supplement   OVER THE COUNTER MEDICATION Take 1 tablet by mouth 2 (two) times daily. Estrogen Control Supplement   OVER THE COUNTER MEDICATION Apply 1 application topically daily. Glutathione Cream   oxyCODONE 5 MG immediate release tablet Commonly known as:  Oxy IR/ROXICODONE Take 1 tablet (5 mg total) by mouth every 4 (four) hours as needed for moderate pain ((score 4 to 6)).   PRESCRIPTION MEDICATION Take 1 Troche by mouth daily. Testosterone 15mg    TART CHERRY ADVANCED PO Take 0.75 Doses by mouth See admin instructions. Mix 3/4 teaspoonful in 1 tablespoon of Apple Cider Vinegar and drink once daily   Turmeric 500 MG Tabs Take 500 mg by mouth daily with supper.   Vitamin D3 125 MCG (5000 UT) Caps Take 10,000 Units by mouth daily.   vitamin k 100 MCG tablet Take 150 mcg by mouth daily.   ZINC PO Take 0.25 Doses by mouth See admin instructions. Mix 0.25 teaspoonful and drink daily        Signed: Cristi LoronJeffrey D Masae Cochran 11/06/2018, 7:37 AM

## 2018-11-08 MED FILL — Sodium Chloride IV Soln 0.9%: INTRAVENOUS | Qty: 1000 | Status: AC

## 2018-11-08 MED FILL — Heparin Sodium (Porcine) Inj 1000 Unit/ML: INTRAMUSCULAR | Qty: 30 | Status: AC

## 2019-08-14 ENCOUNTER — Other Ambulatory Visit (HOSPITAL_COMMUNITY): Payer: Self-pay | Admitting: Internal Medicine

## 2019-08-14 ENCOUNTER — Ambulatory Visit (HOSPITAL_COMMUNITY)
Admission: RE | Admit: 2019-08-14 | Discharge: 2019-08-14 | Disposition: A | Discharge status: Home / Self Care | Payer: PRIVATE HEALTH INSURANCE | Source: Ambulatory Visit | Attending: Internal Medicine | Admitting: Internal Medicine

## 2019-08-14 ENCOUNTER — Other Ambulatory Visit: Payer: Self-pay

## 2019-08-14 DIAGNOSIS — M79662 Pain in left lower leg: Secondary | ICD-10-CM | POA: Diagnosis not present

## 2019-08-14 DIAGNOSIS — R609 Edema, unspecified: Secondary | ICD-10-CM | POA: Diagnosis present

## 2019-10-11 ENCOUNTER — Other Ambulatory Visit (HOSPITAL_BASED_OUTPATIENT_CLINIC_OR_DEPARTMENT_OTHER): Payer: Self-pay

## 2019-10-11 ENCOUNTER — Encounter (HOSPITAL_BASED_OUTPATIENT_CLINIC_OR_DEPARTMENT_OTHER): Payer: Self-pay

## 2019-10-11 DIAGNOSIS — G4733 Obstructive sleep apnea (adult) (pediatric): Secondary | ICD-10-CM

## 2019-11-29 ENCOUNTER — Other Ambulatory Visit (HOSPITAL_COMMUNITY): Payer: PRIVATE HEALTH INSURANCE

## 2020-01-10 ENCOUNTER — Other Ambulatory Visit (HOSPITAL_COMMUNITY)
Admission: RE | Admit: 2020-01-10 | Discharge: 2020-01-10 | Disposition: A | Discharge status: Home / Self Care | Payer: PRIVATE HEALTH INSURANCE | Source: Ambulatory Visit | Attending: Neurology | Admitting: Neurology

## 2020-01-10 ENCOUNTER — Other Ambulatory Visit: Payer: Self-pay

## 2020-01-10 DIAGNOSIS — Z01812 Encounter for preprocedural laboratory examination: Secondary | ICD-10-CM | POA: Insufficient documentation

## 2020-01-10 DIAGNOSIS — Z20822 Contact with and (suspected) exposure to covid-19: Secondary | ICD-10-CM | POA: Insufficient documentation

## 2020-01-10 LAB — SARS CORONAVIRUS 2 (TAT 6-24 HRS): SARS Coronavirus 2: NEGATIVE

## 2020-01-13 ENCOUNTER — Other Ambulatory Visit: Payer: Self-pay

## 2020-01-13 ENCOUNTER — Ambulatory Visit: Payer: PRIVATE HEALTH INSURANCE | Attending: Internal Medicine | Admitting: Neurology

## 2020-01-13 DIAGNOSIS — G4733 Obstructive sleep apnea (adult) (pediatric): Secondary | ICD-10-CM | POA: Diagnosis not present

## 2020-01-22 NOTE — Procedures (Signed)
[place] A. Merlene Laughter, MD     [email or web address]             NOCTURNAL POLYSOMNOGRAPHY   LOCATION: [facility]   Patient Name: Michael Cochran, Michael Cochran Date: 01/13/2020 Gender: Male D.O.B: June 18, 1961 Age (years): 58 Referring Provider: Asencion Noble Height (inches): 73 Interpreting Physician: Phillips Odor MD, ABSM Weight (lbs): 268 RPSGT: Rosebud Poles BMI: 35 MRN: [id number] Neck Size: 19.00 CLINICAL INFORMATION Sleep Study Type: NPSG     Indication for sleep study: OSA     Epworth Sleepiness Score: 7     SLEEP STUDY TECHNIQUE As per the AASM Manual for the Scoring of Sleep and Associated Events v2.3 (April 2016) with a hypopnea requiring 4% desaturations.  The channels recorded and monitored were frontal, central and occipital EEG, electrooculogram (EOG), submentalis EMG (chin), nasal and oral airflow, thoracic and abdominal wall motion, anterior tibialis EMG, snore microphone, electrocardiogram, and pulse oximetry.  MEDICATIONS Medications self-administered by patient taken the night of the study : N/A  Current Outpatient Medications:  .  anastrozole (ARIMIDEX) 1 MG tablet, Take 0.5 mg by mouth daily., Disp: , Rfl:  .  Black Currant Seed Oil 500 MG CAPS, Take 500 mg by mouth 2 (two) times daily., Disp: , Rfl:  .  Cholecalciferol (VITAMIN D3) 125 MCG (5000 UT) CAPS, Take 10,000 Units by mouth daily. , Disp: , Rfl:  .  cyclobenzaprine (FLEXERIL) 10 MG tablet, Take 1 tablet (10 mg total) by mouth 3 (three) times daily as needed for muscle spasms., Disp: 50 tablet, Rfl: 0 .  DHEA 25 MG CAPS, Take 25 mg by mouth 4 (four) times a week., Disp: , Rfl:  .  docusate sodium (COLACE) 100 MG capsule, Take 1 capsule (100 mg total) by mouth 2 (two) times daily., Disp: 60 capsule, Rfl: 0 .  MAGNESIUM PO, Take 0.5 Doses by mouth See admin instructions. Mix 0.5 teaspoonful and drink daily, Disp: , Rfl:  .  Misc Natural Products (TART CHERRY ADVANCED PO), Take  0.75 Doses by mouth See admin instructions. Mix 3/4 teaspoonful in 1 tablespoon of Apple Cider Vinegar and drink once daily, Disp: , Rfl:  .  Multiple Vitamins-Minerals (ZINC PO), Take 0.25 Doses by mouth See admin instructions. Mix 0.25 teaspoonful and drink daily, Disp: , Rfl:  .  olmesartan (BENICAR) 20 MG tablet, Take 20 mg by mouth daily., Disp: , Rfl:  .  OVER THE COUNTER MEDICATION, Take 1 tablet by mouth 2 (two) times daily. Blood Sugar Synergy Supplement, Disp: , Rfl:  .  OVER THE COUNTER MEDICATION, Take 1 tablet by mouth 2 (two) times daily. Circulation Synergy Supplement, Disp: , Rfl:  .  OVER THE COUNTER MEDICATION, Take 1 tablet by mouth 2 (two) times daily. Liver Refresh Supplement, Disp: , Rfl:  .  OVER THE COUNTER MEDICATION, Take 1 tablet by mouth 2 (two) times daily. Estrogen Control Supplement, Disp: , Rfl:  .  OVER THE COUNTER MEDICATION, Apply 1 application topically daily. Glutathione Cream, Disp: , Rfl:  .  oxyCODONE (OXY IR/ROXICODONE) 5 MG immediate release tablet, Take 1 tablet (5 mg total) by mouth every 4 (four) hours as needed for moderate pain ((score 4 to 6))., Disp: 30 tablet, Rfl: 0 .  PRESCRIPTION MEDICATION, Take 1 Troche by mouth daily. Testosterone 15mg , Disp: , Rfl:  .  Turmeric 500 MG TABS, Take 500 mg by mouth daily with supper. , Disp: , Rfl:  .  vitamin k 100 MCG tablet, Take 150 mcg by mouth  daily. , Disp: , Rfl:      SLEEP ARCHITECTURE The study was initiated at 10:33:55 PM and ended at 5:14:08 AM.  Sleep onset time was 93.5 minutes and the sleep efficiency was 52.3%%. The total sleep time was 209.3 minutes.  Stage REM latency was 93.0 minutes.  The patient spent 8.1%% of the night in stage N1 sleep, 56.9%% in stage N2 sleep, 23.5%% in stage N3 and 11.5% in REM.  Alpha intrusion was absent.  Supine sleep was 39.19%.  RESPIRATORY PARAMETERS The overall apnea/hypopnea index (AHI) was 6.9 per hour. There were 1 total apneas, including 0  obstructive, 1 central and 0 mixed apneas. There were 23 hypopneas and 0 RERAs.  The AHI during Stage REM sleep was 27.5 per hour.  AHI while supine was 5.9 per hour.  The mean oxygen saturation was 93.2%. The minimum SpO2 during sleep was 84.0%.  loud snoring was noted during this study.  CARDIAC DATA The 2 lead EKG demonstrated sinus rhythm. The mean heart rate was 65.0 beats per minute. Other EKG findings include: None.  LEG MOVEMENT DATA Mild periodic limb movement are noted.   IMPRESSIONS 1.  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  The severity however it does not require positive pressure treatment. 2.  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 3.  Abnormal sleep architecture with reduced sleep efficiency is noted.   Argie Ramming, MD Diplomate, American Board of Sleep Medicine.  ELECTRONICALLY SIGNED ON:  01/22/2020, 2:50 PM [facility] PH: [phone]   FX: [phone] ACCREDITED BY THE AMERICAN ACADEMY OF SLEEP MEDICINE

## 2020-12-01 IMAGING — CR DG LUMBAR SPINE 1V
1 series · 1 of 1 positions shown · non-contrast
Comparison: 09/30/2018 MR.

CLINICAL DATA: 57-year-old male for L3-4 fusion. Subsequent
encounter.

EXAM:
LUMBAR SPINE - 1 VIEW

[lateral]
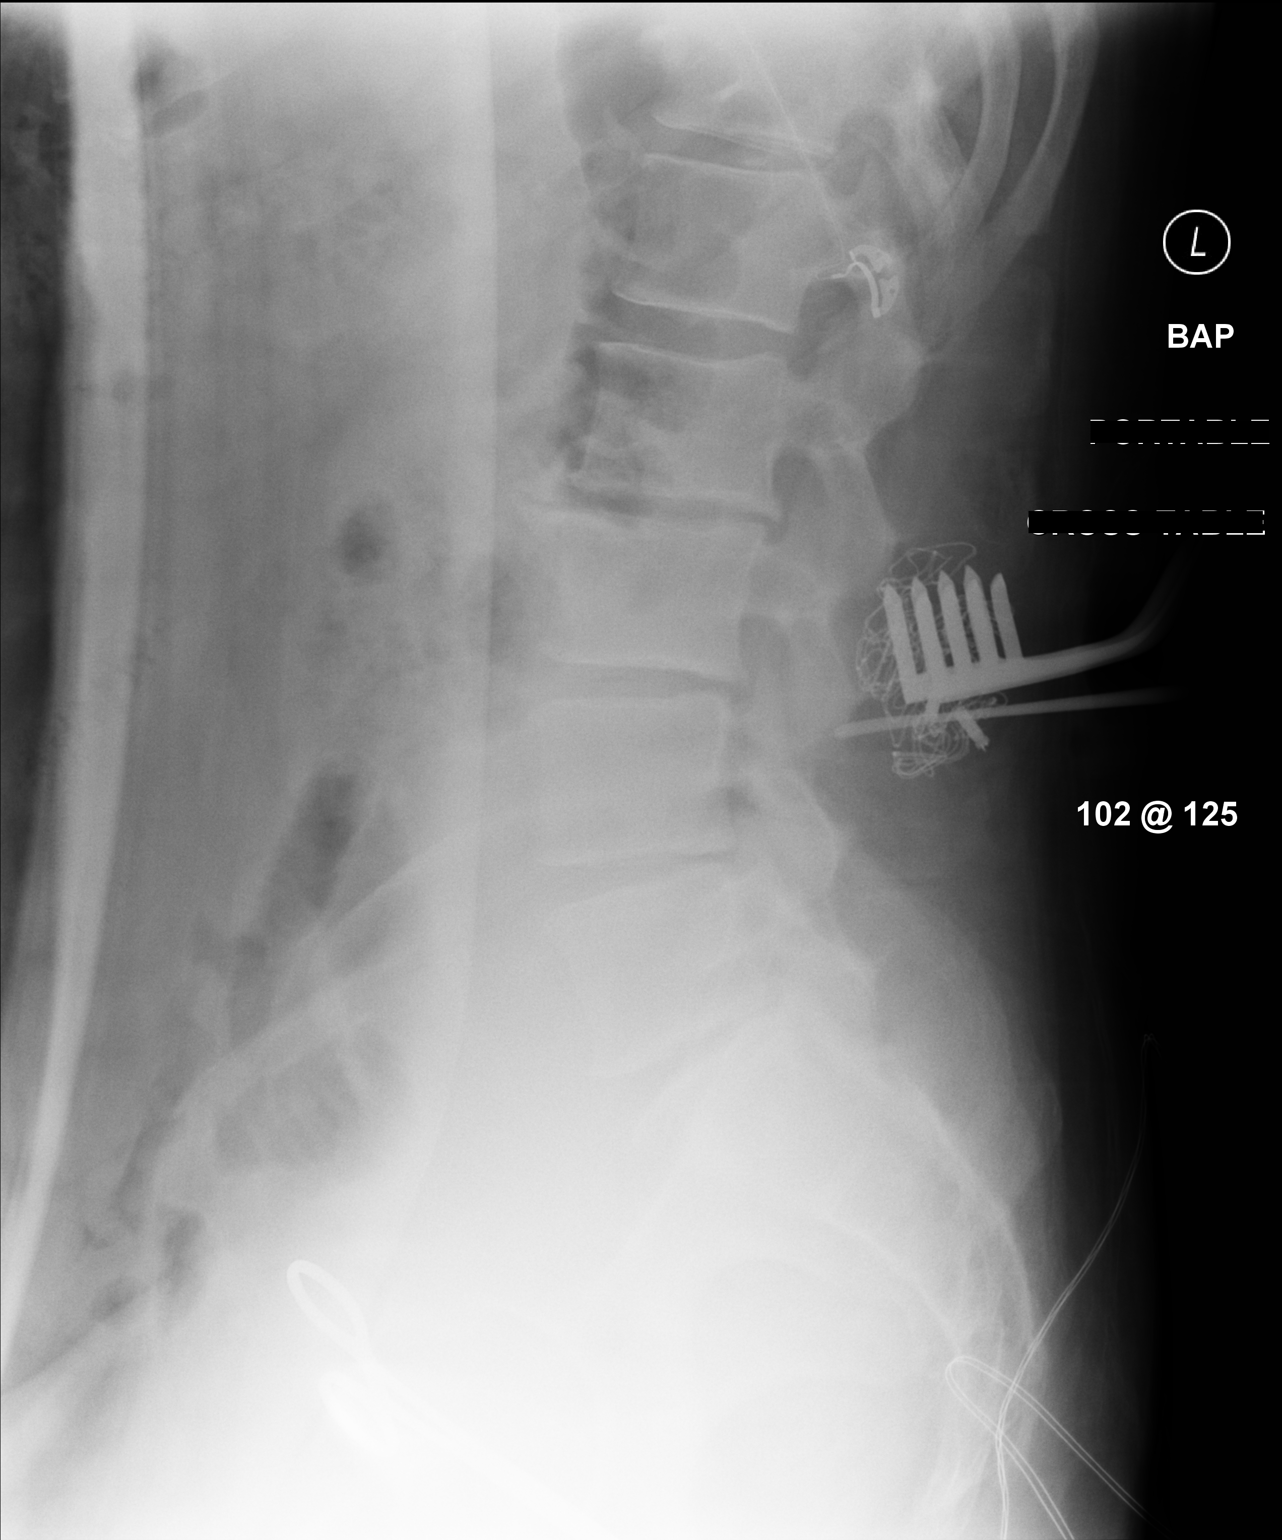

[1 of 1 positions shown; findings below may reference images not displayed]

FINDINGS: Single intraoperative lateral view of the lumbar spine submitted for
review after surgery. Utilizing level assignment on prior MR with
last fully open disc space at the L5-S1 level, inferior metallic
probe is directed towards the L4 pedicle with surgical rakes
posterior to the L3-4 disc space. Surgical sponge(s) in place.
IMPRESSION: Localization L3-4.

## 2021-09-09 IMAGING — US US EXTREM LOW VENOUS*L*
1 series · 13 of 24 positions shown · non-contrast
Comparison: None.

CLINICAL DATA: Left lower extremity pain and edema for the past 9
days. Evaluate for DVT.



[Series 1: us extrem low venous*left* · 0.08mm/px · 13 of 37 slices shown]
[im 1/37]
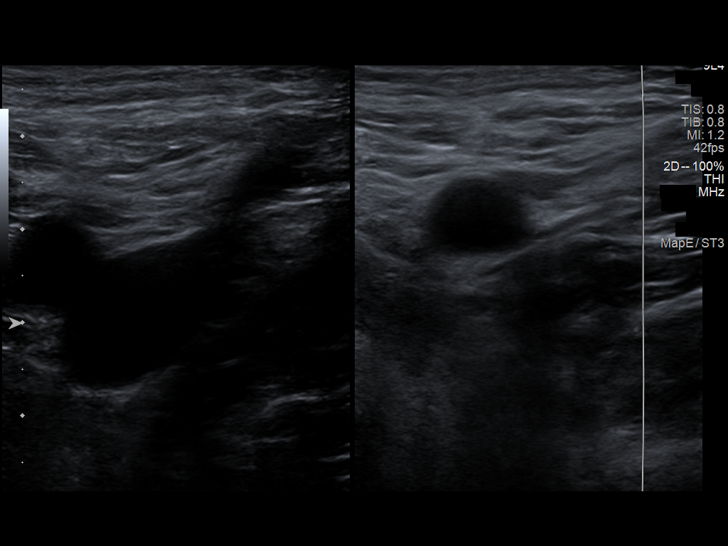
[im 4/37]
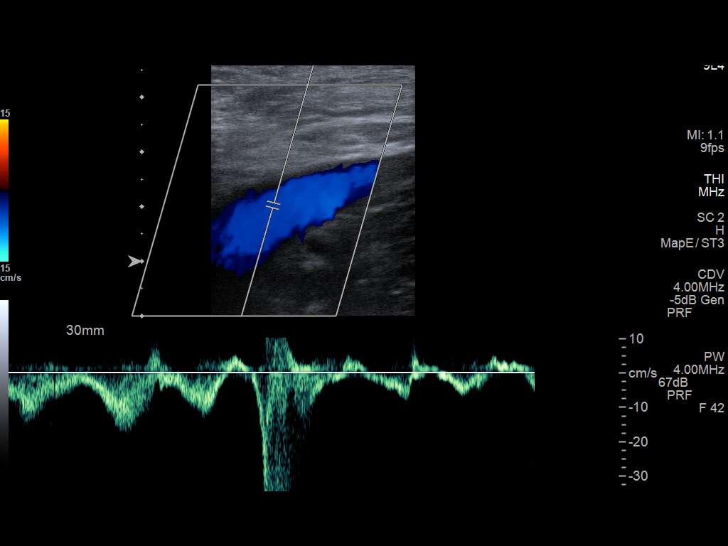
[im 7/37]
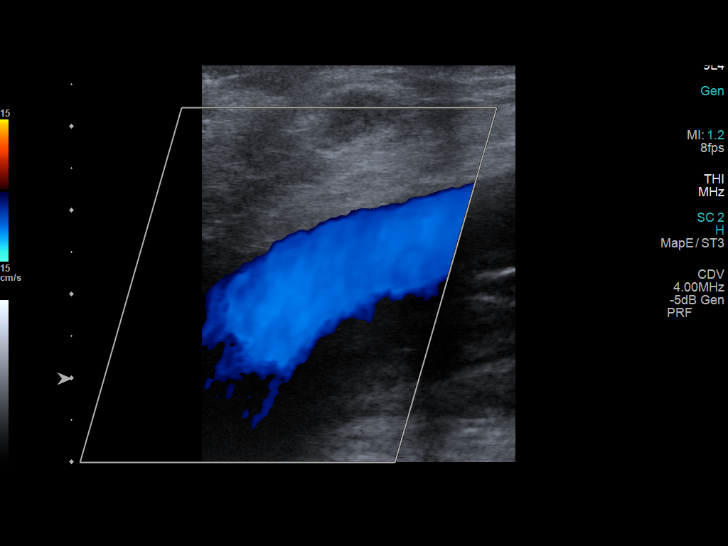
[im 10/37]
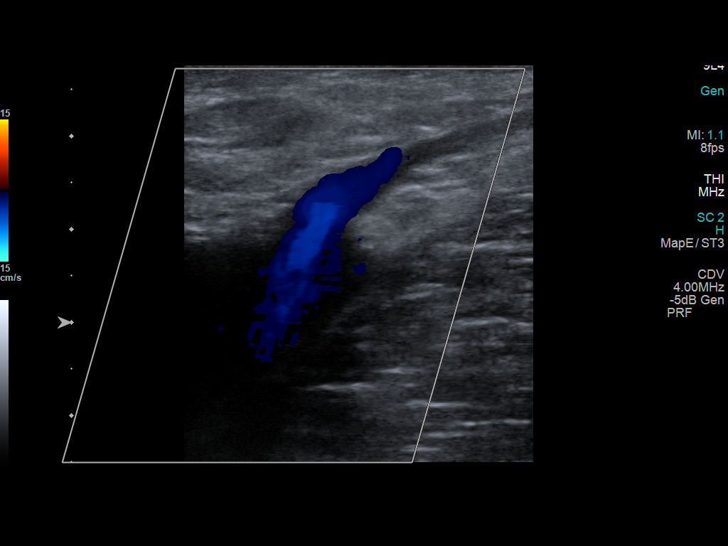
[im 13/37]
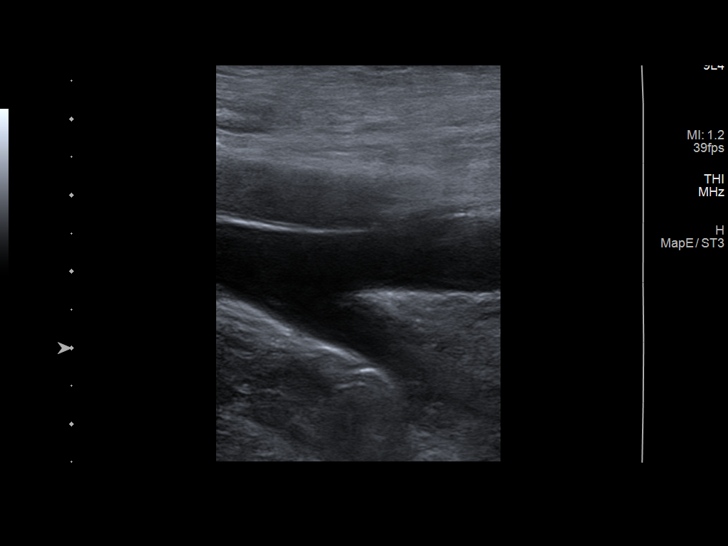
[im 16/37]
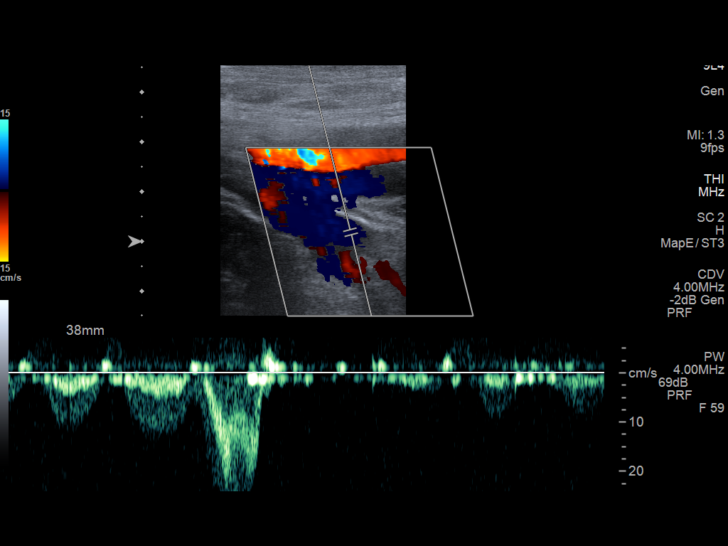
[im 19/37]
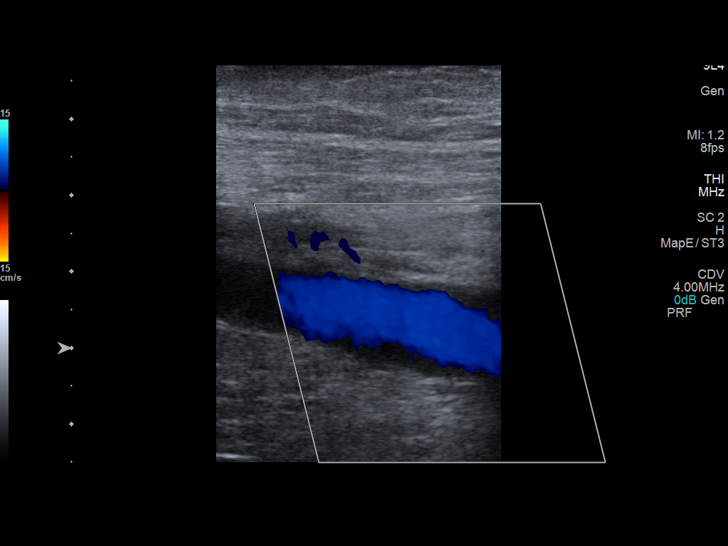
[im 21/37]
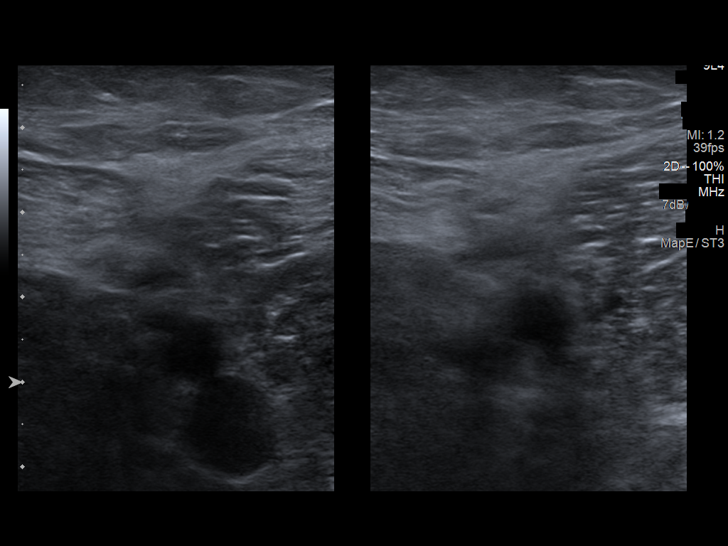
[im 24/37]
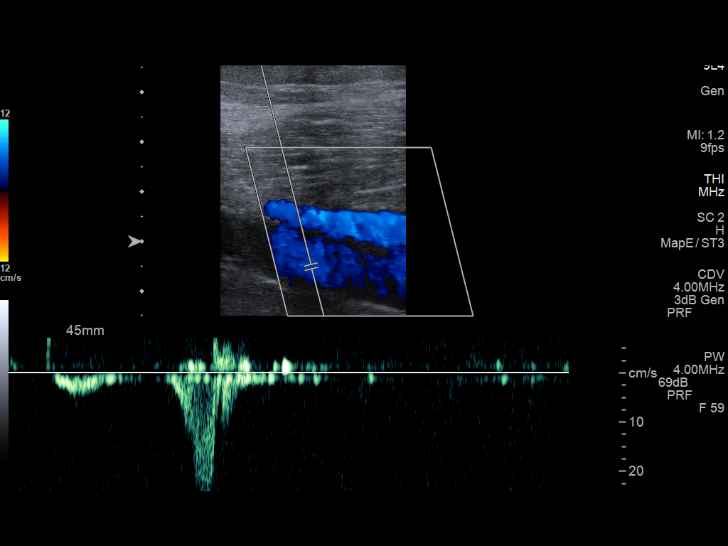
[im 27/37]
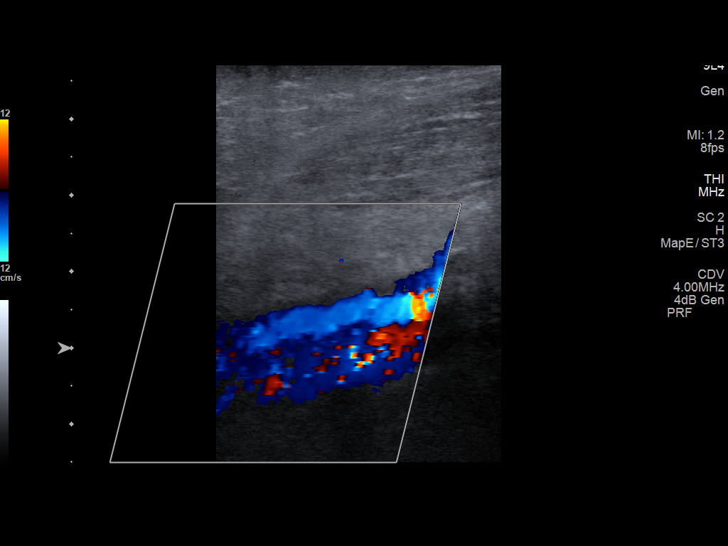
[im 30/37]
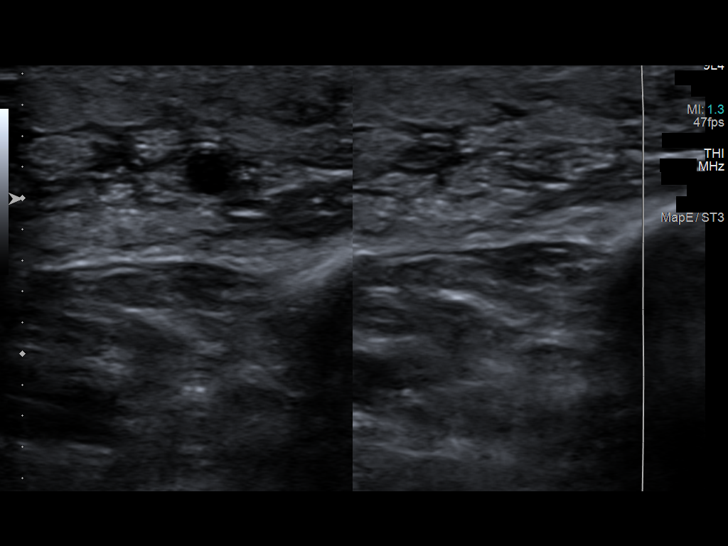
[im 33/37]
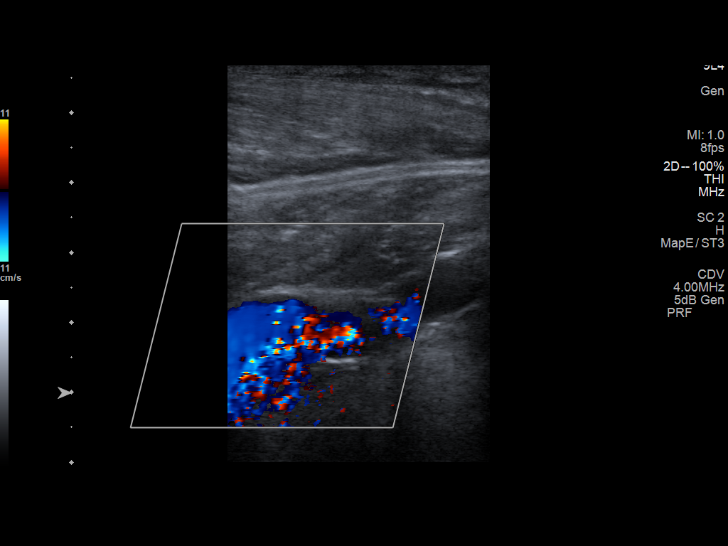
[im 37/37]
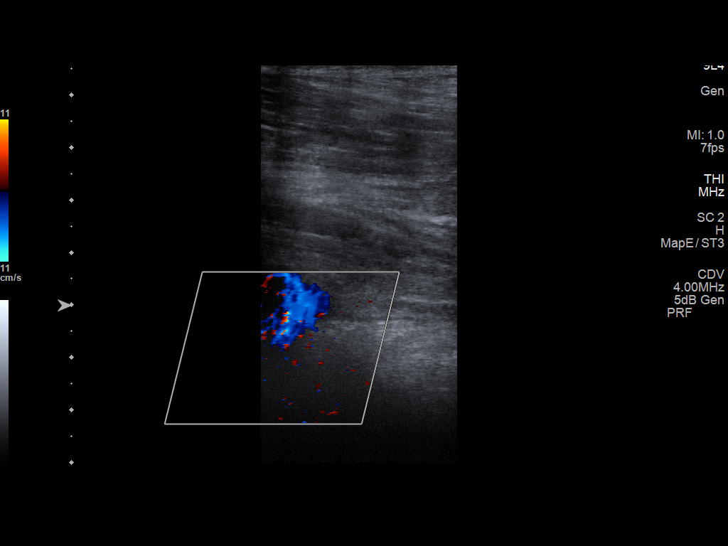

[13 of 24 positions shown; findings below may reference images not displayed]

FINDINGS: Contralateral Common Femoral Vein: Respiratory phasicity is normal
and symmetric with the symptomatic side. No evidence of thrombus.
Normal compressibility.

Common Femoral Vein: No evidence of thrombus. Normal
compressibility, respiratory phasicity and response to augmentation.

Saphenofemoral Junction: No evidence of thrombus. Normal
compressibility and flow on color Doppler imaging.

Profunda Femoral Vein: No evidence of thrombus. Normal
compressibility and flow on color Doppler imaging.

Femoral Vein: No evidence of thrombus. Normal compressibility,
respiratory phasicity and response to augmentation.

Popliteal Vein: No evidence of thrombus. Normal compressibility,
respiratory phasicity and response to augmentation.

Calf Veins: No evidence of thrombus. Normal compressibility and flow
on color Doppler imaging.

Superficial Great Saphenous Vein: No evidence of thrombus. Normal
compressibility.

Venous Reflux:  None.

Other Findings:  None.
IMPRESSION: No evidence of DVT within the left lower extremity.

## 2023-06-06 ENCOUNTER — Encounter: Payer: Self-pay | Admitting: *Deleted

## 2023-09-06 ENCOUNTER — Encounter: Payer: Self-pay | Admitting: *Deleted

## 2023-09-21 ENCOUNTER — Other Ambulatory Visit (HOSPITAL_COMMUNITY): Payer: Self-pay | Admitting: Internal Medicine

## 2023-09-21 ENCOUNTER — Ambulatory Visit (HOSPITAL_COMMUNITY)
Admission: RE | Admit: 2023-09-21 | Discharge: 2023-09-21 | Disposition: A | Discharge status: Home / Self Care | Payer: 59 | Source: Ambulatory Visit | Attending: Internal Medicine | Admitting: Internal Medicine

## 2023-09-21 DIAGNOSIS — M79672 Pain in left foot: Secondary | ICD-10-CM | POA: Diagnosis present

## 2023-12-07 ENCOUNTER — Encounter (INDEPENDENT_AMBULATORY_CARE_PROVIDER_SITE_OTHER): Payer: Self-pay | Admitting: *Deleted
